# Patient Record
Sex: Female | Born: 1960 | Race: White | Hispanic: No | Marital: Married | Smoking: Never smoker
Health system: Southern US, Community
[De-identification: ages and names within clinical notes are randomized; demographics above are authoritative.]

## PROBLEM LIST (undated history)

## (undated) DIAGNOSIS — E079 Disorder of thyroid, unspecified: Secondary | ICD-10-CM

## (undated) DIAGNOSIS — G4730 Sleep apnea, unspecified: Secondary | ICD-10-CM

## (undated) DIAGNOSIS — M8580 Other specified disorders of bone density and structure, unspecified site: Secondary | ICD-10-CM

## (undated) DIAGNOSIS — M51369 Other intervertebral disc degeneration, lumbar region without mention of lumbar back pain or lower extremity pain: Secondary | ICD-10-CM

## (undated) DIAGNOSIS — E039 Hypothyroidism, unspecified: Secondary | ICD-10-CM

## (undated) DIAGNOSIS — L570 Actinic keratosis: Secondary | ICD-10-CM

## (undated) DIAGNOSIS — N200 Calculus of kidney: Secondary | ICD-10-CM

## (undated) DIAGNOSIS — K219 Gastro-esophageal reflux disease without esophagitis: Secondary | ICD-10-CM

## (undated) DIAGNOSIS — G2581 Restless legs syndrome: Secondary | ICD-10-CM

## (undated) DIAGNOSIS — F419 Anxiety disorder, unspecified: Secondary | ICD-10-CM

## (undated) DIAGNOSIS — M5136 Other intervertebral disc degeneration, lumbar region: Secondary | ICD-10-CM

## (undated) DIAGNOSIS — Z87442 Personal history of urinary calculi: Secondary | ICD-10-CM

## (undated) HISTORY — PX: ABDOMINAL HYSTERECTOMY: SHX81

## (undated) HISTORY — DX: Actinic keratosis: L57.0

## (undated) HISTORY — DX: Restless legs syndrome: G25.81

---

## 2006-06-13 ENCOUNTER — Ambulatory Visit: Payer: Self-pay

## 2007-06-30 ENCOUNTER — Ambulatory Visit: Payer: Self-pay

## 2009-03-13 ENCOUNTER — Ambulatory Visit: Payer: Self-pay | Admitting: Internal Medicine

## 2009-04-16 ENCOUNTER — Ambulatory Visit: Payer: Self-pay | Admitting: Internal Medicine

## 2009-05-21 HISTORY — PX: SUPRACERVICAL ABDOMINAL HYSTERECTOMY: SHX5393

## 2009-06-15 ENCOUNTER — Ambulatory Visit: Payer: Self-pay | Admitting: Unknown Physician Specialty

## 2009-12-05 ENCOUNTER — Ambulatory Visit: Payer: Self-pay | Admitting: Unknown Physician Specialty

## 2009-12-16 ENCOUNTER — Ambulatory Visit: Payer: Self-pay | Admitting: Unknown Physician Specialty

## 2009-12-19 LAB — PATHOLOGY REPORT

## 2010-06-02 ENCOUNTER — Ambulatory Visit: Payer: Self-pay | Admitting: Internal Medicine

## 2010-06-16 ENCOUNTER — Ambulatory Visit: Payer: Self-pay | Admitting: Unknown Physician Specialty

## 2011-04-12 ENCOUNTER — Ambulatory Visit: Payer: Self-pay

## 2011-06-19 ENCOUNTER — Ambulatory Visit: Payer: Self-pay | Admitting: Unknown Physician Specialty

## 2012-05-21 HISTORY — PX: COLONOSCOPY: SHX174

## 2012-07-02 ENCOUNTER — Ambulatory Visit: Payer: Self-pay | Admitting: Internal Medicine

## 2012-10-29 ENCOUNTER — Ambulatory Visit: Payer: Self-pay | Admitting: Family Medicine

## 2013-03-30 ENCOUNTER — Ambulatory Visit: Payer: Self-pay | Admitting: Gastroenterology

## 2013-05-05 ENCOUNTER — Ambulatory Visit: Payer: Self-pay

## 2013-05-28 ENCOUNTER — Ambulatory Visit: Payer: Self-pay

## 2013-07-03 ENCOUNTER — Ambulatory Visit: Payer: Self-pay | Admitting: Internal Medicine

## 2015-01-07 ENCOUNTER — Other Ambulatory Visit: Payer: Self-pay | Admitting: Physical Medicine and Rehabilitation

## 2015-01-07 DIAGNOSIS — M5417 Radiculopathy, lumbosacral region: Secondary | ICD-10-CM

## 2015-01-12 ENCOUNTER — Ambulatory Visit
Admission: RE | Admit: 2015-01-12 | Discharge: 2015-01-12 | Disposition: A | Discharge status: Home / Self Care | Payer: BLUE CROSS/BLUE SHIELD | Source: Ambulatory Visit | Attending: Physical Medicine and Rehabilitation | Admitting: Physical Medicine and Rehabilitation

## 2015-01-12 DIAGNOSIS — M5416 Radiculopathy, lumbar region: Secondary | ICD-10-CM | POA: Insufficient documentation

## 2015-01-12 DIAGNOSIS — M5136 Other intervertebral disc degeneration, lumbar region: Secondary | ICD-10-CM | POA: Insufficient documentation

## 2015-01-12 DIAGNOSIS — M5417 Radiculopathy, lumbosacral region: Secondary | ICD-10-CM

## 2015-01-28 ENCOUNTER — Ambulatory Visit
Admission: EM | Admit: 2015-01-28 | Discharge: 2015-01-28 | Disposition: A | Discharge status: Home / Self Care | Payer: BLUE CROSS/BLUE SHIELD | Attending: Family Medicine | Admitting: Family Medicine

## 2015-01-28 DIAGNOSIS — J0111 Acute recurrent frontal sinusitis: Secondary | ICD-10-CM

## 2015-01-28 HISTORY — DX: Disorder of thyroid, unspecified: E07.9

## 2015-01-28 MED ORDER — IPRATROPIUM-ALBUTEROL 0.5-2.5 (3) MG/3ML IN SOLN: 3.0000 mL | Freq: Once | RESPIRATORY_TRACT | Status: DC

## 2015-01-28 MED ORDER — BENZONATATE 100 MG PO CAPS: 100.0000 mg | ORAL_CAPSULE | Freq: Three times a day (TID) | ORAL | Status: DC | PRN

## 2015-01-28 MED ORDER — AMOXICILLIN-POT CLAVULANATE 875-125 MG PO TABS: 1.0000 | ORAL_TABLET | Freq: Two times a day (BID) | ORAL | Status: AC

## 2015-01-28 NOTE — ED Provider Notes (Signed)
CSN: [id number]     Arrival date & time 01/28/15  1048 History   First MD Initiated Contact with Patient 01/28/15 1303     Chief Complaint  Patient presents with  . Facial Pain   (Consider location/radiation/quality/duration/timing/severity/associated sxs/prior Treatment) HPI   55 yo F with hx of Fall sinus infections- usually treats with Augmentin or Levaquin- has only been sick for 2 days this episode but is leaving on vacation. Feels "really bad" Anxious to take medication with her or take now. Periorbital pressure, full feeling ears, post nasal drip and persistent non-productive cough .Uses ZyrtecD- not Flonase. No fever.  Past Medical History  Diagnosis Date  . Thyroid disease    Past Surgical History  Procedure Laterality Date  . Abdominal hysterectomy     History reviewed. No pertinent family history. Social History  Substance Use Topics  . Smoking status: Never Smoker   . Smokeless tobacco: None  . Alcohol Use: No   OB History    No data available     Review of Systems Review of 10 systems negative for acute change except as referenced in HPI  Allergies  Erythromycin and Neosporin  Home Medications   Prior to Admission medications   Medication Sig Start Date End Date Taking? Authorizing Provider  cetirizine (ZYRTEC) 10 MG chewable tablet Chew 10 mg by mouth daily.   Yes Historical Provider, MD  cholecalciferol (VITAMIN D) 1000 UNITS tablet Take 1,000 Units by mouth daily.   Yes Historical Provider, MD  levothyroxine (SYNTHROID, LEVOTHROID) 100 MCG tablet Take 100 mcg by mouth daily before breakfast.   Yes Historical Provider, MD  progesterone (PROMETRIUM) 100 MG capsule Take 100 mg by mouth daily.   Yes Historical Provider, MD  venlafaxine (EFFEXOR) 37.5 MG tablet Take 37.5 mg by mouth 2 (two) times daily.   Yes Historical Provider, MD  amoxicillin-clavulanate (AUGMENTIN) 875-125 MG per tablet Take 1 tablet by mouth 2 (two) times daily. 01/28/15 02/07/15  Jan Fireman,  PA-C  benzonatate (TESSALON) 100 MG capsule Take 1 capsule (100 mg total) by mouth 3 (three) times daily as needed. 01/28/15   Jan Fireman, PA-C   Meds Ordered and Administered this Visit  Medications - No data to display  BP 127/69 mmHg  Pulse 90  Temp(Src) 98.4 F (36.9 C) (Tympanic)  Resp 16  Ht 5\' 4"  (1.626 m)  Wt 169 lb (76.658 kg)  BMI 28.99 kg/m2  SpO2 99% No data found.   Physical Exam   Constitutional -alert and oriented,well appearing and in mild  acute distress Head-atraumatic, normocephalic Eyes- conjunctiva normal, EOMI ,conjugate gaze Ears- canals clear, TMs retacted  R>L, no erythema Nose- has congestion and rhinorrhea Mouth/throat- mucous membranes moist ,oropharynx non-erythematous, post nasal drip Neck- supple without glandular enlargement CV- regular rate, grossly normal heart sounds,  Resp-no distress, normal respiratory effort,clear to auscultation bilaterally, cough repetitive, dry GI- no distention GU- not examined MSK- non tender, normal ROM, all extremities, ambulatory, self-care Neuro- normal speech and language, no gross focal neurological deficit appreciated,  Skin-warm,dry ,intact; no rash noted Psych-mood and affect grossly normal; speech and behavior grossly normal ED Course  Procedures (including critical care time)  Labs Review Labs Reviewed - No data to display  Imaging Review No results found.  Medications  ipratropium-albuterol (DUONEB) 0.5-2.5 (3) MG/3ML nebulizer solution 3 mL (not administered)   Chest loosened- increased cough briefly-felt better  MDM   1. Acute recurrent frontal sinusitis   .Plan: 1.  Diagnosis reviewed with  patient-seasonal allergies suspected in Sinusitis-work toward better control 2. Fo symptoms Rx Benzonatate, fluticasone,Augmentin, Zyrtec D ; risks, benefits, potential side effects reviewed with patient 3. Recommend supportive treatment with OTC guafenesin DM/tylenol/ibuprofen/fluids 4. Seek  additional medical care if symptoms worsen or don't improve;        Jan Fireman, PA-C 01/28/15 1415

## 2015-01-28 NOTE — ED Notes (Signed)
Pt states sinus pressure for 2 day, severe nasal congestion. States she is going on a cruise next week and does not want to go sick.

## 2015-01-28 NOTE — Discharge Instructions (Signed)
Increase Zyrtec to twice daily for 3 days then decrease to daily Guaifenesin DM 1 tsp every 4 hours Flonase  1spray per nostril twice daily for 3-5 days then decrease to daily Augmentin 1 tablet twice daily for 10 days   Sinusitis Sinusitis is redness, soreness, and inflammation of the paranasal sinuses. Paranasal sinuses are air pockets within the bones of your face (beneath the eyes, the middle of the forehead, or above the eyes). In healthy paranasal sinuses, mucus is able to drain out, and air is able to circulate through them by way of your nose. However, when your paranasal sinuses are inflamed, mucus and air can become trapped. This can allow bacteria and other germs to grow and cause infection. Sinusitis can develop quickly and last only a short time (acute) or continue over a long period (chronic). Sinusitis that lasts for more than 12 weeks is considered chronic.  CAUSES  Causes of sinusitis include:  Allergies.  Structural abnormalities, such as displacement of the cartilage that separates your nostrils (deviated septum), which can decrease the air flow through your nose and sinuses and affect sinus drainage.  Functional abnormalities, such as when the small hairs (cilia) that line your sinuses and help remove mucus do not work properly or are not present. SIGNS AND SYMPTOMS  Symptoms of acute and chronic sinusitis are the same. The primary symptoms are pain and pressure around the affected sinuses. Other symptoms include:  Upper toothache.  Earache.  Headache.  Bad breath.  Decreased sense of smell and taste.  A cough, which worsens when you are lying flat.  Fatigue.  Fever.  Thick drainage from your nose, which often is green and may contain pus (purulent).  Swelling and warmth over the affected sinuses. DIAGNOSIS  Your health care provider will perform a physical exam. During the exam, your health care provider may:  Look in your nose for signs of abnormal  growths in your nostrils (nasal polyps).  Tap over the affected sinus to check for signs of infection.  View the inside of your sinuses (endoscopy) using an imaging device that has a light attached (endoscope). If your health care provider suspects that you have chronic sinusitis, one or more of the following tests may be recommended:  Allergy tests.  Nasal culture. A sample of mucus is taken from your nose, sent to a lab, and screened for bacteria.  Nasal cytology. A sample of mucus is taken from your nose and examined by your health care provider to determine if your sinusitis is related to an allergy. TREATMENT  Most cases of acute sinusitis are related to a viral infection and will resolve on their own within 10 days. Sometimes medicines are prescribed to help relieve symptoms (pain medicine, decongestants, nasal steroid sprays, or saline sprays).  However, for sinusitis related to a bacterial infection, your health care provider will prescribe antibiotic medicines. These are medicines that will help kill the bacteria causing the infection.  Rarely, sinusitis is caused by a fungal infection. In theses cases, your health care provider will prescribe antifungal medicine. For some cases of chronic sinusitis, surgery is needed. Generally, these are cases in which sinusitis recurs more than 3 times per year, despite other treatments. HOME CARE INSTRUCTIONS   Drink plenty of water. Water helps thin the mucus so your sinuses can drain more easily.  Use a humidifier.  Inhale steam 3 to 4 times a day (for example, sit in the bathroom with the shower running).  Apply a warm,  moist washcloth to your face 3 to 4 times a day, or as directed by your health care provider.  Use saline nasal sprays to help moisten and clean your sinuses.  Take medicines only as directed by your health care provider.  If you were prescribed either an antibiotic or antifungal medicine, finish it all even if you start  to feel better. SEEK IMMEDIATE MEDICAL CARE IF:  You have increasing pain or severe headaches.  You have nausea, vomiting, or drowsiness.  You have swelling around your face.  You have vision problems.  You have a stiff neck.  You have difficulty breathing. MAKE SURE YOU:   Understand these instructions.  Will watch your condition.  Will get help right away if you are not doing well or get worse. Document Released: 05/07/2005 Document Revised: 09/21/2013 Document Reviewed: 05/22/2011 [facility] Patient Information 2015 [place], [place]. This information is not intended to replace advice given to you by your health care provider. Make sure you discuss any questions you have with your health care provider.

## 2017-01-07 ENCOUNTER — Other Ambulatory Visit: Payer: Self-pay | Admitting: Obstetrics and Gynecology

## 2017-01-07 NOTE — Telephone Encounter (Signed)
Please advise for refill. Thank you.  

## 2017-03-11 ENCOUNTER — Other Ambulatory Visit: Payer: Self-pay | Admitting: Internal Medicine

## 2017-03-11 DIAGNOSIS — Z1231 Encounter for screening mammogram for malignant neoplasm of breast: Secondary | ICD-10-CM

## 2017-03-14 ENCOUNTER — Ambulatory Visit
Admission: RE | Admit: 2017-03-14 | Discharge: 2017-03-14 | Disposition: A | Discharge status: Home / Self Care | Payer: Managed Care, Other (non HMO) | Source: Ambulatory Visit | Attending: Internal Medicine | Admitting: Internal Medicine

## 2017-03-14 DIAGNOSIS — Z1231 Encounter for screening mammogram for malignant neoplasm of breast: Secondary | ICD-10-CM | POA: Diagnosis present

## 2017-03-18 ENCOUNTER — Other Ambulatory Visit: Payer: Self-pay | Admitting: Internal Medicine

## 2017-03-18 DIAGNOSIS — R928 Other abnormal and inconclusive findings on diagnostic imaging of breast: Secondary | ICD-10-CM

## 2017-03-18 DIAGNOSIS — N6320 Unspecified lump in the left breast, unspecified quadrant: Secondary | ICD-10-CM

## 2017-03-22 ENCOUNTER — Ambulatory Visit
Admission: RE | Admit: 2017-03-22 | Discharge: 2017-03-22 | Disposition: A | Discharge status: Home / Self Care | Payer: Managed Care, Other (non HMO) | Source: Ambulatory Visit | Attending: Internal Medicine | Admitting: Internal Medicine

## 2017-03-22 DIAGNOSIS — N6320 Unspecified lump in the left breast, unspecified quadrant: Secondary | ICD-10-CM

## 2017-03-22 DIAGNOSIS — N6321 Unspecified lump in the left breast, upper outer quadrant: Secondary | ICD-10-CM | POA: Diagnosis not present

## 2017-03-22 DIAGNOSIS — N6323 Unspecified lump in the left breast, lower outer quadrant: Secondary | ICD-10-CM | POA: Diagnosis not present

## 2017-03-22 DIAGNOSIS — N6322 Unspecified lump in the left breast, upper inner quadrant: Secondary | ICD-10-CM | POA: Insufficient documentation

## 2017-03-22 DIAGNOSIS — R928 Other abnormal and inconclusive findings on diagnostic imaging of breast: Secondary | ICD-10-CM

## 2017-03-25 ENCOUNTER — Other Ambulatory Visit: Payer: Self-pay | Admitting: Internal Medicine

## 2017-03-25 DIAGNOSIS — N6320 Unspecified lump in the left breast, unspecified quadrant: Secondary | ICD-10-CM

## 2017-03-25 DIAGNOSIS — R928 Other abnormal and inconclusive findings on diagnostic imaging of breast: Secondary | ICD-10-CM

## 2017-03-26 ENCOUNTER — Other Ambulatory Visit: Payer: Managed Care, Other (non HMO)

## 2017-03-26 ENCOUNTER — Ambulatory Visit: Payer: Managed Care, Other (non HMO)

## 2017-04-03 ENCOUNTER — Other Ambulatory Visit: Payer: Self-pay | Admitting: Internal Medicine

## 2017-04-03 ENCOUNTER — Ambulatory Visit
Admission: RE | Admit: 2017-04-03 | Discharge: 2017-04-03 | Disposition: A | Discharge status: Home / Self Care | Payer: Managed Care, Other (non HMO) | Source: Ambulatory Visit | Attending: Internal Medicine | Admitting: Internal Medicine

## 2017-04-03 DIAGNOSIS — N6320 Unspecified lump in the left breast, unspecified quadrant: Secondary | ICD-10-CM

## 2017-04-03 DIAGNOSIS — R928 Other abnormal and inconclusive findings on diagnostic imaging of breast: Secondary | ICD-10-CM

## 2017-04-17 ENCOUNTER — Ambulatory Visit (INDEPENDENT_AMBULATORY_CARE_PROVIDER_SITE_OTHER): Payer: Managed Care, Other (non HMO) | Admitting: Obstetrics and Gynecology

## 2017-04-17 ENCOUNTER — Other Ambulatory Visit: Payer: Self-pay

## 2017-04-17 VITALS — BP 120/82 | HR 76 | Ht 64.0 in | Wt 174.0 lb

## 2017-04-17 DIAGNOSIS — Z79890 Hormone replacement therapy (postmenopausal): Secondary | ICD-10-CM

## 2017-04-17 DIAGNOSIS — Z1231 Encounter for screening mammogram for malignant neoplasm of breast: Secondary | ICD-10-CM | POA: Diagnosis not present

## 2017-04-17 DIAGNOSIS — N951 Menopausal and female climacteric states: Secondary | ICD-10-CM

## 2017-04-17 DIAGNOSIS — Z1151 Encounter for screening for human papillomavirus (HPV): Secondary | ICD-10-CM

## 2017-04-17 DIAGNOSIS — Z1239 Encounter for other screening for malignant neoplasm of breast: Secondary | ICD-10-CM

## 2017-04-17 DIAGNOSIS — N6320 Unspecified lump in the left breast, unspecified quadrant: Secondary | ICD-10-CM | POA: Diagnosis not present

## 2017-04-17 DIAGNOSIS — Z01419 Encounter for gynecological examination (general) (routine) without abnormal findings: Secondary | ICD-10-CM | POA: Diagnosis not present

## 2017-04-17 DIAGNOSIS — Z124 Encounter for screening for malignant neoplasm of cervix: Secondary | ICD-10-CM

## 2017-04-17 MED ORDER — PROGESTERONE MICRONIZED 100 MG PO CAPS: ORAL_CAPSULE | ORAL | 3 refills | Status: DC

## 2017-04-17 NOTE — Progress Notes (Signed)
PCP: Idelle Crouch, MD   Chief Complaint  Patient presents with  . Gynecologic Exam    No Complaints    HPI:      [name] is a 56 y.o. No obstetric history on file. who LMP was No LMP recorded. Patient has had a hysterectomy., presents today for her annual examination.  Her menses are absent due to menopause/lap supracx hyst due to leio and pelvic pain. She does not have intermenstrual bleeding.  She does not have vasomotor sx. She uses minivelle patch 0.0375 once wkly instead of twice and prometrium 100 mg 6 nights on, 1 night off with sx control.  Sex activity: single partner, contraception - post menopausal status. She does not have vaginal dryness.  Last Pap: August 20, 2013  Results were: no abnormalities /neg HPV DNA.  Hx of STDs: none  Last mammogram: March 22, 2017  Results were: cat 3 for LT breast probable fat lobule on u/s. Repeat LT breast u/s due in 6 months. There is no FH of breast cancer. There is no FH of ovarian cancer. The patient does do self-breast exams.  Colonoscopy: colonoscopy 4 years ago without abnormalities. . Repeat due after 10 years.   Tobacco use: The patient denies current or previous tobacco use. Alcohol use: none Exercise: not active  She does get adequate calcium and Vitamin D in her diet.  Labs with PCP.  Past Medical History:  Diagnosis Date  . Restless leg syndrome   . Thyroid disease     Past Surgical History:  Procedure Laterality Date  . COLONOSCOPY  2014   wnl repeat 10 yrs  . SUPRACERVICAL ABDOMINAL HYSTERECTOMY  2011   pelvic pain and leio    Family History  Problem Relation Age of Onset  . Hypothyroidism Mother   . Heart disease Father   . Hyperlipidemia Father   . Heart disease Sister   . Breast cancer Neg Hx     Social History   Socioeconomic History  . Marital status: Widowed    Spouse name: Not on file  . Number of children: Not on file  . Years of education: Not on file  . Highest  education level: Not on file  Social Needs  . Financial resource strain: Not on file  . Food insecurity - worry: Not on file  . Food insecurity - inability: Not on file  . Transportation needs - medical: Not on file  . Transportation needs - non-medical: Not on file  Occupational History  . Not on file  Tobacco Use  . Smoking status: Never Smoker  . Smokeless tobacco: Never Used  Substance and Sexual Activity  . Alcohol use: No  . Drug use: No  . Sexual activity: Yes    Partners: Male  Other Topics Concern  . Not on file  Social History Narrative  . Not on file    Current Meds  Medication Sig  . cetirizine (ZYRTEC) 10 MG chewable tablet Chew 10 mg by mouth daily.  . cholecalciferol (VITAMIN D) 1000 UNITS tablet Take 1,000 Units by mouth daily.  Marland Kitchen doxycycline (ORACEA) 40 MG capsule Take 40 mg by mouth every morning.  Marland Kitchen estradiol (VIVELLE-DOT) 0.0375 MG/24HR Place 1 patch onto the skin 2 (two) times a week.  . levothyroxine (SYNTHROID, LEVOTHROID) 100 MCG tablet Take 100 mcg by mouth daily before breakfast.  . pantoprazole (PROTONIX) 20 MG tablet Take 20 mg by mouth daily.  . phentermine 37.5 MG capsule Take 37.5  mg by mouth every morning.  . progesterone (PROMETRIUM) 100 MG capsule TAKE 1 CAPSULE AT BEDTIME, 6 NIGHTS ON 1 NIGHT OFF  . rOPINIRole (REQUIP) 0.5 MG tablet Take 0.5 mg by mouth 3 (three) times daily.  Marland Kitchen venlafaxine (EFFEXOR) 37.5 MG tablet Take 37.5 mg by mouth 2 (two) times daily.  . [DISCONTINUED] progesterone (PROMETRIUM) 100 MG capsule TAKE 1 CAPSULE AT BEDTIME, 6 NIGHTS ON 1 NIGHT OFF      ROS:  Review of Systems  Constitutional: Negative for fatigue, fever and unexpected weight change.  Respiratory: Negative for cough, shortness of breath and wheezing.   Cardiovascular: Negative for chest pain, palpitations and leg swelling.  Gastrointestinal: Negative for blood in stool, constipation, diarrhea, nausea and vomiting.  Endocrine: Negative for cold  intolerance, heat intolerance and polyuria.  Genitourinary: Negative for dyspareunia, dysuria, flank pain, frequency, genital sores, hematuria, menstrual problem, pelvic pain, urgency, vaginal bleeding, vaginal discharge and vaginal pain.  Musculoskeletal: Negative for back pain, joint swelling and myalgias.  Skin: Negative for rash.  Neurological: Negative for dizziness, syncope, light-headedness, numbness and headaches.  Hematological: Negative for adenopathy.  Psychiatric/Behavioral: Negative for agitation, confusion, sleep disturbance and suicidal ideas. The patient is not nervous/anxious.      Objective: BP 120/82 (BP Location: Left Arm, Patient Position: Sitting, Cuff Size: Normal)   Pulse 76   Ht 5\' 4"  (1.626 m)   Wt 174 lb (78.9 kg)   BMI 29.87 kg/m    Physical Exam  Constitutional: She is oriented to person, place, and time. She appears well-developed and well-nourished.  Genitourinary: Vagina normal. There is no rash or tenderness on the right labia. There is no rash or tenderness on the left labia. No erythema or tenderness in the vagina. No vaginal discharge found. Right adnexum does not display mass and does not display tenderness. Left adnexum does not display mass and does not display tenderness. Cervix does not exhibit motion tenderness, lesion or friability.  Genitourinary Comments: UTERUS SURG REM  Neck: Normal range of motion. No thyromegaly present.  Cardiovascular: Normal rate, regular rhythm and normal heart sounds.  No murmur heard. Pulmonary/Chest: Effort normal and breath sounds normal. Right breast exhibits no mass, no nipple discharge, no skin change and no tenderness. Left breast exhibits no mass, no nipple discharge, no skin change and no tenderness.  Abdominal: Soft. There is no tenderness. There is no guarding.  Musculoskeletal: Normal range of motion.  Neurological: She is alert and oriented to person, place, and time. No cranial nerve deficit.    Psychiatric: She has a normal mood and affect. Her behavior is normal.  Vitals reviewed.   Assessment/Plan:  Encounter for annual routine gynecological examination  Cervical cancer screening - Plan: IGP, Aptima HPV  Screening for HPV (human papillomavirus) - Plan: IGP, Aptima HPV  Screening for breast cancer - Pt to sched LT breast u/s f/u 4/19. - Plan: US BREAST LTD UNI LEFT INC AXILLA  Left breast mass - Plan: US BREAST LTD UNI LEFT INC AXILLA  Hormone replacement therapy (HRT) - Rx RF prometrium to mail order. Pt to stop minivelle to see if sx tolerable off ERT. Will RF if sx recur. If ok off ERT, will then wean off prog. F/u prn. - Plan: progesterone (PROMETRIUM) 100 MG capsule  Vasomotor symptoms due to menopause - Plan: progesterone (PROMETRIUM) 100 MG capsule   Meds ordered this encounter  Medications  . progesterone (PROMETRIUM) 100 MG capsule    Sig: TAKE 1 CAPSULE AT BEDTIME, 6  NIGHTS ON 1 NIGHT OFF    Dispense:  90 capsule    Refill:  3    PLS DO NOT FILL RX YET--PLACE ON HOLD.            GYN counsel breast self exam, mammography screening, menopause, adequate intake of calcium and vitamin D, diet and exercise    F/U  Return in about 1 year (around 04/17/2018).  Alicia B. Copland, PA-C 04/17/2017 9:14 AM

## 2017-04-17 NOTE — Patient Instructions (Signed)
I value your feedback and entrusting us with your care. If you get a [facility] patient survey, I would appreciate you taking the time to let us know about your experience today. Thank you! 

## 2017-04-19 LAB — IGP, APTIMA HPV
HPV APTIMA: NEGATIVE
PAP Smear Comment: 0

## 2017-05-07 ENCOUNTER — Encounter: Payer: Self-pay | Admitting: Obstetrics and Gynecology

## 2017-05-07 ENCOUNTER — Other Ambulatory Visit: Payer: Self-pay | Admitting: Obstetrics and Gynecology

## 2017-05-07 MED ORDER — ESTRADIOL 0.5 MG PO TABS: 0.5000 mg | ORAL_TABLET | Freq: Every day | ORAL | 9 refills | Status: DC

## 2017-05-07 NOTE — Progress Notes (Signed)
Rx estradiol for vasomotor sx. Patch too expensive. Tried to wean off.

## 2017-06-17 ENCOUNTER — Other Ambulatory Visit: Payer: Self-pay | Admitting: Obstetrics and Gynecology

## 2017-06-17 ENCOUNTER — Telehealth: Payer: Self-pay

## 2017-06-17 MED ORDER — ESTRADIOL 0.5 MG PO TABS: 0.5000 mg | ORAL_TABLET | Freq: Every day | ORAL | 2 refills | Status: DC

## 2017-06-17 NOTE — Telephone Encounter (Signed)
Done

## 2017-06-17 NOTE — Telephone Encounter (Signed)
Pt aware.

## 2017-06-17 NOTE — Telephone Encounter (Signed)
[facility]. Where does pt want it sent? It's in system as estrace crm, with 10 months worth, sent in 12/18.

## 2017-06-17 NOTE — Telephone Encounter (Signed)
Pt needs it sent to [facility].  Has new Rx Card which has not been scanned into system. BIN# [id number] RX Group # [id number].

## 2017-06-17 NOTE — Progress Notes (Signed)
Rx RF to new mail order.

## 2017-06-17 NOTE — Telephone Encounter (Signed)
Pt saw ABC 04/17/17. Rxs not showing on [facility].  MyChart says they can't be renewed.  Pt needs refills.  [phone]

## 2017-06-19 ENCOUNTER — Telehealth: Payer: Self-pay

## 2017-06-19 NOTE — Telephone Encounter (Signed)
Pt states she had a rx for progesterone & says it has not been sent to the [facility]. She did see where the other one was mailed in to them, but no the progesterone. She believe it was sent to the old pharmacy & needs to be sent to the new one. Cb#[phone]

## 2017-06-19 NOTE — Telephone Encounter (Signed)
Spoke w/pt. Notified rx for Progesterone/Prometrium was sent to [facility] on the day of her AE 04/17/17 with receipt from pharmacy 9:13am. Pt will check w/pharmacy & contact us back if she needs further assistance.

## 2017-07-10 ENCOUNTER — Other Ambulatory Visit: Payer: Self-pay

## 2017-07-10 ENCOUNTER — Ambulatory Visit
Admission: EM | Admit: 2017-07-10 | Discharge: 2017-07-10 | Disposition: A | Discharge status: Home / Self Care | Payer: Managed Care, Other (non HMO) | Attending: Family Medicine | Admitting: Family Medicine

## 2017-07-10 DIAGNOSIS — H6502 Acute serous otitis media, left ear: Secondary | ICD-10-CM

## 2017-07-10 MED ORDER — AMOXICILLIN 875 MG PO TABS: 875.0000 mg | ORAL_TABLET | Freq: Two times a day (BID) | ORAL | 0 refills | Status: DC

## 2017-07-10 NOTE — ED Triage Notes (Signed)
Pt reports "I have a sinus infection." Sx x Sunday of congestion, otalgia, facial pain and pressure, headache.

## 2017-07-10 NOTE — ED Provider Notes (Signed)
[facility]    CSN: [id number] Arrival date & time: 07/10/17  0910     History   Chief Complaint Chief Complaint  Patient presents with  . Sinusitis    HPI RONALEE SCHEUNEMANN is a 57 y.o. female.    Sinusitis  Associated symptoms: congestion, cough, ear pain, fever and rhinorrhea   Associated symptoms: no wheezing   URI  Presenting symptoms: congestion, cough, ear pain, facial pain, fever and rhinorrhea   Severity:  Moderate Onset quality:  Sudden Duration:  4 days Timing:  Constant Progression:  Worsening Chronicity:  New Relieved by:  Nothing Ineffective treatments:  OTC medications Associated symptoms: sinus pain   Associated symptoms: no wheezing   Risk factors: sick contacts   Risk factors: not elderly, no chronic cardiac disease, no chronic kidney disease, no chronic respiratory disease, no diabetes mellitus, no immunosuppression, no recent illness and no recent travel     Past Medical History:  Diagnosis Date  . Restless leg syndrome   . Thyroid disease     There are no active problems to display for this patient.   Past Surgical History:  Procedure Laterality Date  . COLONOSCOPY  2014   wnl repeat 10 yrs  . SUPRACERVICAL ABDOMINAL HYSTERECTOMY  2011   pelvic pain and leio    OB History    No data available       Home Medications    Prior to Admission medications   Medication Sig Start Date End Date Taking? Authorizing Provider  amoxicillin (AMOXIL) 875 MG tablet Take 1 tablet (875 mg total) by mouth 2 (two) times daily. 07/10/17   Norval Gable, MD  benzonatate (TESSALON) 100 MG capsule Take 1 capsule (100 mg total) by mouth 3 (three) times daily as needed. Patient not taking: Reported on 04/17/2017 01/28/15   Jan Fireman, PA-C  cetirizine (ZYRTEC) 10 MG chewable tablet Chew 10 mg by mouth daily.    [provider]  cholecalciferol (VITAMIN D) 1000 UNITS tablet Take 1,000 Units by mouth daily.    [provider]    doxycycline (ORACEA) 40 MG capsule Take 40 mg by mouth every morning.    [provider]  estradiol (ESTRACE) 0.5 MG tablet Take 1 tablet (0.5 mg total) by mouth daily. 7/61/95   Copland, Deirdre Evener, PA-C  levothyroxine (SYNTHROID, LEVOTHROID) 100 MCG tablet Take 100 mcg by mouth daily before breakfast.    [provider]  pantoprazole (PROTONIX) 20 MG tablet Take 20 mg by mouth daily.    [provider]  phentermine 37.5 MG capsule Take 37.5 mg by mouth every morning.    [provider]  progesterone (PROMETRIUM) 100 MG capsule TAKE 1 CAPSULE AT BEDTIME, 6 NIGHTS ON 1 NIGHT OFF 09/32/67   Copland, Alicia B, PA-C  rOPINIRole (REQUIP) 0.5 MG tablet Take 0.5 mg by mouth 3 (three) times daily.    [provider]  venlafaxine (EFFEXOR) 37.5 MG tablet Take 37.5 mg by mouth 2 (two) times daily.    [provider]    Family History Family History  Problem Relation Age of Onset  . Hypothyroidism Mother   . Heart disease Father   . Hyperlipidemia Father   . Heart disease Sister   . Breast cancer Neg Hx     Social History Social History   Tobacco Use  . Smoking status: Never Smoker  . Smokeless tobacco: Never Used  Substance Use Topics  . Alcohol use: No  . Drug use:  No     Allergies   Erythromycin and Neosporin [neomycin-bacitracin zn-polymyx]   Review of Systems Review of Systems  Constitutional: Positive for fever.  HENT: Positive for congestion, ear pain, rhinorrhea and sinus pain.   Respiratory: Positive for cough. Negative for wheezing.      Physical Exam Triage Vital Signs ED Triage Vitals  Enc Vitals Group     BP 07/10/17 0928 (!) 142/69     Pulse Rate 07/10/17 0928 (!) 114     Resp 07/10/17 0928 18     Temp 07/10/17 0928 98.6 F (37 C)     Temp Source 07/10/17 0928 Oral     SpO2 07/10/17 0928 100 %     Weight 07/10/17 0929 170 lb (77.1 kg)     Height 07/10/17 0929 5\' 4"  (1.626 m)     Head Circumference --       Peak Flow --      Pain Score 07/10/17 0929 4     Pain Loc --      Pain Edu? --      Excl. in [place]? --    No data found.  Updated Vital Signs BP (!) 142/69 (BP Location: Left Arm)   Pulse (!) 114   Temp 98.6 F (37 C) (Oral)   Resp 18   Ht 5\' 4"  (1.626 m)   Wt 170 lb (77.1 kg)   SpO2 100%   BMI 29.18 kg/m   Visual Acuity Right Eye Distance:   Left Eye Distance:   Bilateral Distance:    Right Eye Near:   Left Eye Near:    Bilateral Near:     Physical Exam  Constitutional: She appears well-developed and well-nourished. No distress.  HENT:  Head: Normocephalic and atraumatic.  Right Ear: Tympanic membrane, external ear and ear canal normal.  Left Ear: External ear and ear canal normal. Tympanic membrane is erythematous and bulging. A middle ear effusion is present.  Nose: Rhinorrhea present. No mucosal edema, nose lacerations, sinus tenderness, nasal deformity, septal deviation or nasal septal hematoma. No epistaxis.  No foreign bodies. Right sinus exhibits no maxillary sinus tenderness and no frontal sinus tenderness. Left sinus exhibits no maxillary sinus tenderness and no frontal sinus tenderness.  Mouth/Throat: Uvula is midline, oropharynx is clear and moist and mucous membranes are normal. No oropharyngeal exudate.  Eyes: Conjunctivae and EOM are normal. Pupils are equal, round, and reactive to light. Right eye exhibits no discharge. Left eye exhibits no discharge. No scleral icterus.  Neck: Normal range of motion. Neck supple. No thyromegaly present.  Cardiovascular: Normal rate, regular rhythm and normal heart sounds.  Pulmonary/Chest: Effort normal and breath sounds normal. No respiratory distress. She has no wheezes. She has no rales.  Lymphadenopathy:    She has no cervical adenopathy.  Skin: She is not diaphoretic.  Nursing note and vitals reviewed.    UC Treatments / Results  Labs (all labs ordered are listed, but only abnormal results are displayed) Labs  Reviewed - No data to display  EKG  EKG Interpretation None       Radiology No results found.  Procedures Procedures (including critical care time)  Medications Ordered in UC Medications - No data to display   Initial Impression / Assessment and Plan / UC Course  I have reviewed the triage vital signs and the nursing notes.  Pertinent labs & imaging results that were available during my care of the patient were reviewed by me and considered in my medical  decision making (see chart for details).      Final Clinical Impressions(s) / UC Diagnoses   Final diagnoses:  Acute serous otitis media of left ear, recurrence not specified    ED Discharge Orders        Ordered    amoxicillin (AMOXIL) 875 MG tablet  2 times daily     07/10/17 1000     1. diagnosis reviewed with patient 2. rx as per orders above; reviewed possible side effects, interactions, risks and benefits  3. Recommend supportive treatment with otc analgesics prn   4. Follow-up prn if symptoms worsen or don't improve  Controlled Substance Prescriptions [place] Controlled Substance Registry consulted? Not Applicable   Norval Gable, MD 07/10/17 1020

## 2017-07-12 ENCOUNTER — Telehealth: Payer: Self-pay | Admitting: Emergency Medicine

## 2017-07-12 MED ORDER — LORATADINE-PSEUDOEPHEDRINE ER 10-240 MG PO TB24: 1.0000 | ORAL_TABLET | Freq: Every day | ORAL | 0 refills | Status: DC

## 2017-07-12 MED ORDER — FLUTICASONE PROPIONATE 50 MCG/ACT NA SUSP: 2.0000 | Freq: Every day | NASAL | 0 refills | Status: DC

## 2017-07-12 NOTE — Telephone Encounter (Signed)
Patient called with continued congestion requesting prescription of Flonase and Claritin-D.  Will E prescribe to pharmacy of choice.

## 2017-09-20 ENCOUNTER — Ambulatory Visit
Admission: RE | Admit: 2017-09-20 | Discharge: 2017-09-20 | Disposition: A | Discharge status: Home / Self Care | Payer: Managed Care, Other (non HMO) | Source: Ambulatory Visit | Attending: Obstetrics and Gynecology | Admitting: Obstetrics and Gynecology

## 2017-09-20 DIAGNOSIS — N6002 Solitary cyst of left breast: Secondary | ICD-10-CM | POA: Diagnosis not present

## 2017-09-20 DIAGNOSIS — N6321 Unspecified lump in the left breast, upper outer quadrant: Secondary | ICD-10-CM | POA: Diagnosis not present

## 2017-09-20 DIAGNOSIS — N6320 Unspecified lump in the left breast, unspecified quadrant: Secondary | ICD-10-CM

## 2017-09-20 DIAGNOSIS — Z1239 Encounter for other screening for malignant neoplasm of breast: Secondary | ICD-10-CM

## 2017-09-23 ENCOUNTER — Telehealth: Payer: Self-pay | Admitting: Obstetrics and Gynecology

## 2017-09-23 DIAGNOSIS — Z1239 Encounter for other screening for malignant neoplasm of breast: Secondary | ICD-10-CM

## 2017-09-23 DIAGNOSIS — R928 Other abnormal and inconclusive findings on diagnostic imaging of breast: Secondary | ICD-10-CM

## 2017-09-23 NOTE — Telephone Encounter (Signed)
Pt aware of recent LT breast u/s results. No mass visible on f/u to bx. Repeat dx mammo bilat and LT breast u/s in 6 months recommended. Orders placed.

## 2018-03-18 ENCOUNTER — Other Ambulatory Visit: Payer: Self-pay | Admitting: Obstetrics and Gynecology

## 2018-03-18 ENCOUNTER — Telehealth: Payer: Self-pay

## 2018-03-18 MED ORDER — ESTRADIOL 0.5 MG PO TABS: 0.5000 mg | ORAL_TABLET | Freq: Every day | ORAL | 0 refills | Status: DC

## 2018-03-18 NOTE — Telephone Encounter (Signed)
Pt needs refill on estrace, enough to last till annual.

## 2018-03-18 NOTE — Telephone Encounter (Signed)
Please advise 

## 2018-03-18 NOTE — Telephone Encounter (Signed)
Rx eRxd to mail order.

## 2018-03-18 NOTE — Progress Notes (Signed)
Rx RF till annual 12/19.

## 2018-03-23 ENCOUNTER — Other Ambulatory Visit: Payer: Self-pay | Admitting: Obstetrics and Gynecology

## 2018-03-24 ENCOUNTER — Encounter: Payer: Self-pay | Admitting: Obstetrics and Gynecology

## 2018-03-24 ENCOUNTER — Ambulatory Visit
Admission: RE | Admit: 2018-03-24 | Discharge: 2018-03-24 | Disposition: A | Discharge status: Home / Self Care | Payer: Managed Care, Other (non HMO) | Source: Ambulatory Visit | Attending: Obstetrics and Gynecology | Admitting: Obstetrics and Gynecology

## 2018-03-24 DIAGNOSIS — Z1239 Encounter for other screening for malignant neoplasm of breast: Secondary | ICD-10-CM

## 2018-03-24 DIAGNOSIS — R928 Other abnormal and inconclusive findings on diagnostic imaging of breast: Secondary | ICD-10-CM | POA: Diagnosis not present

## 2018-03-27 ENCOUNTER — Other Ambulatory Visit: Payer: Self-pay

## 2018-03-27 ENCOUNTER — Telehealth: Payer: Self-pay

## 2018-03-27 MED ORDER — ESTRADIOL 0.5 MG PO TABS: 0.5000 mg | ORAL_TABLET | Freq: Every day | ORAL | 0 refills | Status: DC

## 2018-03-27 NOTE — Telephone Encounter (Signed)
Pt states she has a apt with [facility] on DEC 5th she needs a refill of her estradiol, states the pharmacy sent a request and it was rejected. Please advise

## 2018-03-27 NOTE — Telephone Encounter (Signed)
Spoke to pt and updated her pharmacies since RF for estrace was sent to an old mail order service, she asked [facility] and I [facility]. Pt aware of it and reminded her of annual 12/9.

## 2018-04-27 NOTE — Progress Notes (Signed)
PCP: Idelle Crouch, MD   Chief Complaint  Patient presents with  . Gynecologic Exam    HPI:      [name] is a 57 y.o. No obstetric history on file. who LMP was No LMP recorded. Patient has had a hysterectomy., presents today for her annual examination.  Her menses are absent due to menopause/lap supracx hyst due to leio and pelvic pain. She does not have intermenstrual bleeding.  She does not have vasomotor sx. She uses estradiol 0.5 mg daily and prometrium 100 mg 6 nights on, 1 night off with sx control. Sx recur if she misses a dose. Tried to come off ERT patch last yr with sx recurrence. Wanted to try oral due to cost. Sex activity: single partner, contraception - post menopausal status. She does not have vaginal dryness.  Last Pap:04/17/17 Results were: no abnormalities /neg HPV DNA.  Hx of STDs: none  Last mammogram: 03/24/18  Results were: normal. Repeat in 12 months. 11/18 Cat 3 for LT breast probable fat lobule on u/s; stable with recent imaging.  There is no FH of breast cancer. There is no FH of ovarian cancer. The patient does do self-breast exams.  Colonoscopy: colonoscopy 5 years ago without abnormalities.  Repeat due after 10 years.   Tobacco use: The patient denies current or previous tobacco use. Alcohol use: none Exercise: not active  She does get adequate calcium and Vitamin D in her diet.  Labs with PCP.  Past Medical History:  Diagnosis Date  . Restless leg syndrome   . Thyroid disease     Past Surgical History:  Procedure Laterality Date  . ABDOMINAL HYSTERECTOMY    . COLONOSCOPY  2014   wnl repeat 10 yrs  . SUPRACERVICAL ABDOMINAL HYSTERECTOMY  2011   pelvic pain and leio    Family History  Problem Relation Age of Onset  . Hypothyroidism Mother   . Heart disease Father   . Hyperlipidemia Father   . Heart disease Sister   . Breast cancer Neg Hx     Social History   Socioeconomic History  . Marital status: Widowed   Spouse name: Not on file  . Number of children: Not on file  . Years of education: Not on file  . Highest education level: Not on file  Occupational History  . Not on file  Social Needs  . Financial resource strain: Not on file  . Food insecurity:    Worry: Not on file    Inability: Not on file  . Transportation needs:    Medical: Not on file    Non-medical: Not on file  Tobacco Use  . Smoking status: Never Smoker  . Smokeless tobacco: Never Used  Substance and Sexual Activity  . Alcohol use: No  . Drug use: No  . Sexual activity: Yes    Partners: Male    Birth control/protection: Surgical    Comment: Hysterectomy  Lifestyle  . Physical activity:    Days per week: Not on file    Minutes per session: Not on file  . Stress: Not on file  Relationships  . Social connections:    Talks on phone: Not on file    Gets together: Not on file    Attends religious service: Not on file    Active member of club or organization: Not on file    Attends meetings of clubs or organizations: Not on file    Relationship status: Not on file  .  Intimate partner violence:    Fear of current or ex partner: Not on file    Emotionally abused: Not on file    Physically abused: Not on file    Forced sexual activity: Not on file  Other Topics Concern  . Not on file  Social History Narrative  . Not on file    Current Meds  Medication Sig  . cetirizine (ZYRTEC) 10 MG chewable tablet Chew 10 mg by mouth daily.  . cholecalciferol (VITAMIN D) 1000 UNITS tablet Take 1,000 Units by mouth daily.  Marland Kitchen desonide (DESOWEN) 0.05 % lotion   . doxycycline (ORACEA) 40 MG capsule Take 40 mg by mouth every morning.  Marland Kitchen estradiol (ESTRACE) 0.5 MG tablet Take 1 tablet (0.5 mg total) by mouth daily.  Marland Kitchen levothyroxine (SYNTHROID, LEVOTHROID) 25 MCG tablet Take by mouth.  . pantoprazole (PROTONIX) 20 MG tablet Take 20 mg by mouth daily.  . progesterone (PROMETRIUM) 100 MG capsule TAKE 1 CAPSULE AT BEDTIME, 6 NIGHTS  ON 1 NIGHT OFF  . rOPINIRole (REQUIP) 0.25 MG tablet Take by mouth.  . venlafaxine (EFFEXOR) 37.5 MG tablet Take 37.5 mg by mouth 2 (two) times daily.  . [DISCONTINUED] estradiol (ESTRACE) 0.5 MG tablet Take 1 tablet (0.5 mg total) by mouth daily.  . [DISCONTINUED] progesterone (PROMETRIUM) 100 MG capsule TAKE 1 CAPSULE AT BEDTIME, 6 NIGHTS ON 1 NIGHT OFF      ROS:  Review of Systems  Constitutional: Negative for fatigue, fever and unexpected weight change.  Respiratory: Negative for cough, shortness of breath and wheezing.   Cardiovascular: Negative for chest pain, palpitations and leg swelling.  Gastrointestinal: Negative for blood in stool, constipation, diarrhea, nausea and vomiting.  Endocrine: Negative for cold intolerance, heat intolerance and polyuria.  Genitourinary: Negative for dyspareunia, dysuria, flank pain, frequency, genital sores, hematuria, menstrual problem, pelvic pain, urgency, vaginal bleeding, vaginal discharge and vaginal pain.  Musculoskeletal: Negative for back pain, joint swelling and myalgias.  Skin: Negative for rash.  Neurological: Negative for dizziness, syncope, light-headedness, numbness and headaches.  Hematological: Negative for adenopathy.  Psychiatric/Behavioral: Negative for agitation, confusion, sleep disturbance and suicidal ideas. The patient is not nervous/anxious.      Objective: BP 122/70   Pulse 86   Ht 5\' 4"  (1.626 m)   Wt 186 lb (84.4 kg)   BMI 31.93 kg/m    Physical Exam  Constitutional: She is oriented to person, place, and time. She appears well-developed and well-nourished.  Genitourinary: Vagina normal. There is no rash or tenderness on the right labia. There is no rash or tenderness on the left labia. No erythema or tenderness in the vagina. No vaginal discharge found. Right adnexum does not display mass and does not display tenderness. Left adnexum does not display mass and does not display tenderness. Cervix does not exhibit  motion tenderness, lesion or friability.  Genitourinary Comments: UTERUS SURG REM  Neck: Normal range of motion. No thyromegaly present.  Cardiovascular: Normal rate, regular rhythm and normal heart sounds.  No murmur heard. Pulmonary/Chest: Effort normal and breath sounds normal. Right breast exhibits no mass, no nipple discharge, no skin change and no tenderness. Left breast exhibits no mass, no nipple discharge, no skin change and no tenderness.  Abdominal: Soft. There is no tenderness. There is no guarding.  Musculoskeletal: Normal range of motion.  Neurological: She is alert and oriented to person, place, and time. No cranial nerve deficit.  Psychiatric: She has a normal mood and affect. Her behavior is normal.  Vitals  reviewed.   Assessment/Plan:  Encounter for annual routine gynecological examination  Screening for breast cancer - Pt current on mammo  Hormone replacement therapy (HRT) - Rx RF to mail order. Doing well.  - Plan: progesterone (PROMETRIUM) 100 MG capsule, estradiol (ESTRACE) 0.5 MG tablet  Vasomotor symptoms due to menopause - Plan: progesterone (PROMETRIUM) 100 MG capsule, estradiol (ESTRACE) 0.5 MG tablet   Meds ordered this encounter  Medications  . progesterone (PROMETRIUM) 100 MG capsule    Sig: TAKE 1 CAPSULE AT BEDTIME, 6 NIGHTS ON 1 NIGHT OFF    Dispense:  90 capsule    Refill:  3    Order Specific Question:   Supervising Provider    Answer:   Gae Dry [id number]  . estradiol (ESTRACE) 0.5 MG tablet    Sig: Take 1 tablet (0.5 mg total) by mouth daily.    Dispense:  90 tablet    Refill:  3    Order Specific Question:   Supervising Provider    Answer:   Gae Dry [[id number]]            GYN counsel breast self exam, mammography screening, menopause, adequate intake of calcium and vitamin D, diet and exercise    F/U  Return in about 1 year (around 04/29/2019).  Davidlee Jeanbaptiste B. Pleasant Bensinger, PA-C 04/28/2018 8:50 AM

## 2018-04-28 ENCOUNTER — Encounter: Payer: Self-pay | Admitting: Obstetrics and Gynecology

## 2018-04-28 ENCOUNTER — Ambulatory Visit (INDEPENDENT_AMBULATORY_CARE_PROVIDER_SITE_OTHER): Payer: Managed Care, Other (non HMO) | Admitting: Obstetrics and Gynecology

## 2018-04-28 VITALS — BP 122/70 | HR 86 | Ht 64.0 in | Wt 186.0 lb

## 2018-04-28 DIAGNOSIS — Z1239 Encounter for other screening for malignant neoplasm of breast: Secondary | ICD-10-CM

## 2018-04-28 DIAGNOSIS — Z79890 Hormone replacement therapy (postmenopausal): Secondary | ICD-10-CM

## 2018-04-28 DIAGNOSIS — Z01419 Encounter for gynecological examination (general) (routine) without abnormal findings: Secondary | ICD-10-CM | POA: Diagnosis not present

## 2018-04-28 DIAGNOSIS — N951 Menopausal and female climacteric states: Secondary | ICD-10-CM

## 2018-04-28 MED ORDER — ESTRADIOL 0.5 MG PO TABS: 0.5000 mg | ORAL_TABLET | Freq: Every day | ORAL | 3 refills | Status: DC

## 2018-04-28 MED ORDER — PROGESTERONE MICRONIZED 100 MG PO CAPS: ORAL_CAPSULE | ORAL | 3 refills | Status: DC

## 2018-04-28 NOTE — Patient Instructions (Signed)
I value your feedback and entrusting us with your care. If you get a [facility] patient survey, I would appreciate you taking the time to let us know about your experience today. Thank you! 

## 2018-06-24 ENCOUNTER — Telehealth: Payer: Self-pay

## 2018-06-24 NOTE — Telephone Encounter (Signed)
Pt called a week ago c no response.  Needs refill of estradiol from [facility].  Is unable to order it on line.  She is out of it. [phone]  Called [facility], spoke to [place] who explained their process which is:  When they get a new rx if it'd due to be filled in less than 2wks they fill it.  If longer than 2w the put it on 'hold' and the pt needs to call to get it ordered.  Then the rest of the year to pt can order on line.  This was explained to pt.  Also they will send pt email when it is shipped.

## 2019-03-30 ENCOUNTER — Encounter: Payer: Self-pay | Admitting: Emergency Medicine

## 2019-03-30 ENCOUNTER — Emergency Department: Payer: Managed Care, Other (non HMO)

## 2019-03-30 ENCOUNTER — Other Ambulatory Visit: Payer: Self-pay

## 2019-03-30 ENCOUNTER — Emergency Department
Admission: EM | Admit: 2019-03-30 | Discharge: 2019-03-30 | Disposition: A | Discharge status: Home / Self Care | Payer: Managed Care, Other (non HMO) | Attending: Emergency Medicine | Admitting: Emergency Medicine

## 2019-03-30 DIAGNOSIS — N200 Calculus of kidney: Secondary | ICD-10-CM | POA: Diagnosis not present

## 2019-03-30 DIAGNOSIS — Z79899 Other long term (current) drug therapy: Secondary | ICD-10-CM | POA: Diagnosis not present

## 2019-03-30 DIAGNOSIS — R109 Unspecified abdominal pain: Secondary | ICD-10-CM | POA: Diagnosis present

## 2019-03-30 HISTORY — DX: Calculus of kidney: N20.0

## 2019-03-30 LAB — URINALYSIS, COMPLETE (UACMP) WITH MICROSCOPIC
Bilirubin Urine: NEGATIVE
Glucose, UA: NEGATIVE mg/dL
Ketones, ur: NEGATIVE mg/dL
Leukocytes,Ua: NEGATIVE
Nitrite: NEGATIVE
Protein, ur: NEGATIVE mg/dL
Specific Gravity, Urine: 1.018 (ref 1.005–1.030)
pH: 8 (ref 5.0–8.0)

## 2019-03-30 LAB — BASIC METABOLIC PANEL
Anion gap: 10 (ref 5–15)
BUN: 16 mg/dL (ref 6–20)
CO2: 23 mmol/L (ref 22–32)
Calcium: 9.3 mg/dL (ref 8.9–10.3)
Chloride: 106 mmol/L (ref 98–111)
Creatinine, Ser: 0.87 mg/dL (ref 0.44–1.00)
GFR calc Af Amer: >60 mL/min (ref >60)
GFR calc non Af Amer: >60 mL/min (ref >60)
Glucose, Bld: 114 mg/dL — ABNORMAL HIGH (ref 70–99)
Potassium: 4.8 mmol/L (ref 3.5–5.1)
Sodium: 139 mmol/L (ref 135–145)

## 2019-03-30 LAB — CBC
HCT: 43.1 % (ref 36.0–46.0)
Hemoglobin: 14.5 g/dL (ref 12.0–15.0)
MCH: 30.3 pg (ref 26.0–34.0)
MCHC: 33.6 g/dL (ref 30.0–36.0)
MCV: 90 fL (ref 80.0–100.0)
Platelets: 276 10*3/uL (ref 150–400)
RBC: 4.79 MIL/uL (ref 3.87–5.11)
RDW: 12.2 % (ref 11.5–15.5)
WBC: 7.6 10*3/uL (ref 4.0–10.5)
nRBC: 0 % (ref 0.0–0.2)

## 2019-03-30 LAB — HEPATIC FUNCTION PANEL
ALT: 10 U/L (ref 0–44)
AST: 25 U/L (ref 15–41)
Albumin: 4.4 g/dL (ref 3.5–5.0)
Alkaline Phosphatase: 62 U/L (ref 38–126)
Bilirubin, Direct: 0.3 mg/dL — ABNORMAL HIGH (ref 0.0–0.2)
Indirect Bilirubin: 0.5 mg/dL (ref 0.3–0.9)
Total Bilirubin: 0.8 mg/dL (ref 0.3–1.2)
Total Protein: 8 g/dL (ref 6.5–8.1)

## 2019-03-30 MED ORDER — ONDANSETRON 4 MG PO TBDP: 4.0000 mg | ORAL_TABLET | Freq: Three times a day (TID) | ORAL | 0 refills | Status: DC | PRN

## 2019-03-30 MED ORDER — ONDANSETRON HCL 4 MG/2ML IJ SOLN
4.0000 mg | Freq: Once | INTRAMUSCULAR | Status: AC
  Administered 2019-03-30: 11:00:00 4 mg via INTRAVENOUS
  Filled 2019-03-30: qty 2

## 2019-03-30 MED ORDER — KETOROLAC TROMETHAMINE 30 MG/ML IJ SOLN
30.0000 mg | Freq: Once | INTRAMUSCULAR | Status: AC
  Administered 2019-03-30: 30 mg via INTRAVENOUS

## 2019-03-30 MED ORDER — FENTANYL CITRATE (PF) 100 MCG/2ML IJ SOLN
50.0000 ug | Freq: Once | INTRAMUSCULAR | Status: AC
  Administered 2019-03-30: 100 ug via INTRAVENOUS
  Filled 2019-03-30: qty 2

## 2019-03-30 MED ORDER — TAMSULOSIN HCL 0.4 MG PO CAPS
0.4000 mg | ORAL_CAPSULE | Freq: Every day | ORAL | Status: DC
  Administered 2019-03-30: 0.4 mg via ORAL

## 2019-03-30 MED ORDER — KETOROLAC TROMETHAMINE 30 MG/ML IJ SOLN
30.0000 mg | Freq: Once | INTRAMUSCULAR | Status: DC
  Filled 2019-03-30: qty 1

## 2019-03-30 MED ORDER — TAMSULOSIN HCL 0.4 MG PO CAPS: 0.4000 mg | ORAL_CAPSULE | Freq: Every day | ORAL | 0 refills | Status: DC

## 2019-03-30 MED ORDER — SODIUM CHLORIDE 0.9 % IV SOLN
Freq: Once | INTRAVENOUS | Status: AC
  Administered 2019-03-30: 13:00:00 via INTRAVENOUS

## 2019-03-30 MED ORDER — OXYCODONE-ACETAMINOPHEN 5-325 MG PO TABS: 1.0000 | ORAL_TABLET | ORAL | 0 refills | Status: DC | PRN

## 2019-03-30 NOTE — ED Notes (Signed)
Pt assisted to the BR. Pt reports medication helped pain. Currently 5/10.

## 2019-03-30 NOTE — ED Provider Notes (Signed)
[facility] Emergency Department Provider Note  ____________________________________________   First MD Initiated Contact with Patient 03/30/19 1202     (approximate)  I have reviewed the triage vital signs and the nursing notes.   HISTORY  Chief Complaint Flank Pain   HPI Lori Morris is a 58 y.o. female with below list of previous medical conditions including kidney stone 20 years ago presents emergency department secondary to right flank pain began at 8:30 AM this morning.  Patient denies any dysuria.  Patient denies any fever.  Patient denies any nausea or vomiting.  Patient was given fentanyl on arrival to the emergency department and states that while in improved.  Briefly it has now recurred with her current pain score of 8 out of 10.        Past Medical History:  Diagnosis Date  . Kidney stones   . Restless leg syndrome   . Thyroid disease     Patient Active Problem List   Diagnosis Date Noted  . Vasomotor symptoms due to menopause 04/28/2018    Past Surgical History:  Procedure Laterality Date  . ABDOMINAL HYSTERECTOMY    . COLONOSCOPY  2014   wnl repeat 10 yrs  . SUPRACERVICAL ABDOMINAL HYSTERECTOMY  2011   pelvic pain and leio    Prior to Admission medications   Medication Sig Start Date End Date Taking? Authorizing Provider  cetirizine (ZYRTEC) 10 MG chewable tablet Chew 10 mg by mouth daily.    [provider]  cholecalciferol (VITAMIN D) 1000 UNITS tablet Take 1,000 Units by mouth daily.    [provider]  desonide (DESOWEN) 0.05 % lotion  03/19/18   [provider]  doxycycline (ORACEA) 40 MG capsule Take 40 mg by mouth every morning.    [provider]  estradiol (ESTRACE) 0.5 MG tablet Take 1 tablet (0.5 mg total) by mouth daily. 123456   Copland, Deirdre Evener, PA-C  levothyroxine (SYNTHROID, LEVOTHROID) 25 MCG tablet Take by mouth. 04/21/18   [provider]  pantoprazole  (PROTONIX) 20 MG tablet Take 20 mg by mouth daily.    [provider]  progesterone (PROMETRIUM) 100 MG capsule TAKE 1 CAPSULE AT BEDTIME, 6 NIGHTS ON 1 NIGHT OFF 123456   Copland, Alicia B, PA-C  rOPINIRole (REQUIP) 0.25 MG tablet Take by mouth. 02/28/17   [provider]  venlafaxine (EFFEXOR) 37.5 MG tablet Take 37.5 mg by mouth 2 (two) times daily.    [provider]    Allergies Erythromycin and Neosporin [neomycin-bacitracin zn-polymyx]  Family History  Problem Relation Age of Onset  . Hypothyroidism Mother   . Heart disease Father   . Hyperlipidemia Father   . Heart disease Sister   . Breast cancer Neg Hx     Social History Social History   Tobacco Use  . Smoking status: Never Smoker  . Smokeless tobacco: Never Used  Substance Use Topics  . Alcohol use: No  . Drug use: No    Review of Systems Constitutional: No fever/chills Eyes: No visual changes. ENT: No sore throat. Cardiovascular: Denies chest pain. Respiratory: Denies shortness of breath. Gastrointestinal: Positive for right flank pain no nausea, no vomiting.  No diarrhea.  No constipation. Genitourinary: Negative for dysuria. Musculoskeletal: Negative for neck pain.  Negative for back pain. Integumentary: Negative for rash. Neurological: Negative for headaches, focal weakness or numbness.  ____________________________________________   PHYSICAL EXAM:  VITAL SIGNS: ED Triage Vitals  Enc Vitals Group  BP 03/30/19 1036 135/77     Pulse Rate 03/30/19 1036 92     Resp 03/30/19 1036 20     Temp 03/30/19 1038 97.8 F (36.6 C)     Temp Source 03/30/19 1038 Oral     SpO2 03/30/19 1036 100 %     Weight 03/30/19 1036 74.8 kg (165 lb)     Height 03/30/19 1036 1.626 m (5\' 4" )     Head Circumference --      Peak Flow --      Pain Score 03/30/19 1036 10     Pain Loc --      Pain Edu? --      Excl. in [place]? --     Constitutional: Alert and oriented.  Eyes: Conjunctivae are  normal.  Mouth/Throat: Patient is wearing a mask. Neck: No stridor.  No meningeal signs.   Cardiovascular: Normal rate, regular rhythm. Good peripheral circulation. Grossly normal heart sounds. Respiratory: Normal respiratory effort.  No retractions. Gastrointestinal: Soft and nontender. No distention.  Musculoskeletal: No lower extremity tenderness nor edema. No gross deformities of extremities. Neurologic:  Normal speech and language. No gross focal neurologic deficits are appreciated.  Skin:  Skin is warm, dry and intact. Psychiatric: Mood and affect are normal. Speech and behavior are normal.  ____________________________________________   LABS (all labs ordered are listed, but only abnormal results are displayed)  Labs Reviewed  URINALYSIS, COMPLETE (UACMP) WITH MICROSCOPIC - Abnormal; Notable for the following components:      Result Value   Color, Urine YELLOW (*)    APPearance CLEAR (*)    Hgb urine dipstick SMALL (*)    Bacteria, UA RARE (*)    All other components within normal limits  BASIC METABOLIC PANEL - Abnormal; Notable for the following components:   Glucose, Bld 114 (*)    All other components within normal limits  HEPATIC FUNCTION PANEL - Abnormal; Notable for the following components:   Bilirubin, Direct 0.3 (*)    All other components within normal limits  CBC    ___________________________________  RADIOLOGY I, [place] Jakai Risse, personally viewed and evaluated these images (plain radiographs) as part of my medical decision making, as well as reviewing the written report by the radiologist.  ED MD interpretation: 2.6 mm right UVJ stone noted on CT per radiologist.  Also incidental finding of "possible cholelithiasis per radiologist.  Official radiology report(s): Ct Renal Stone Study  Result Date: 03/30/2019 CLINICAL DATA:  Sudden onset of right flank pain this morning. History of kidney stones. EXAM: CT ABDOMEN AND PELVIS WITHOUT CONTRAST TECHNIQUE:  Multidetector CT imaging of the abdomen and pelvis was performed following the standard protocol without IV contrast. COMPARISON:  None. FINDINGS: Lower chest: Normal. Hepatobiliary: Multiple benign-appearing cysts in the right lobe of the liver, the largest being 2.2 cm in the dome of the right lobe. There is inhomogeneous density of the gallbladder suggestive of gallstones. There is no gallbladder wall thickening or pericholecystic fluid or dilatation of the bile ducts. Pancreas: Unremarkable. No pancreatic ductal dilatation or surrounding inflammatory changes. Spleen: Normal in size without focal abnormality. Adrenals/Urinary Tract: There is a 2.6 mm stone in the right ureterovesical junction with slight dilatation of the right ureter. There are no renal calculi. Adrenal glands are normal. Left kidney appears normal. Bladder is normal. Stomach/Bowel: Stomach is within normal limits. Appendix appears normal. No evidence of bowel wall thickening, distention, or inflammatory changes. Vascular/Lymphatic: No significant vascular findings are present. No enlarged abdominal or  pelvic lymph nodes. Reproductive: 19 mm cyst on the left ovary. Supracervical hysterectomy. Normal right ovary. Other: No abdominal wall hernia or abnormality. No abdominopelvic ascites. Musculoskeletal: No acute or significant osseous findings. IMPRESSION: 1. 2.6 mm stone in the right ureterovesical junction causing slight dilatation of the right ureter. 2. Multiple benign-appearing cysts in the liver. 3. Possible cholelithiasis. Electronically Signed   By: Lorriane Shire M.D.   On: 03/30/2019 12:15      Procedures   ____________________________________________   INITIAL IMPRESSION / MDM / ASSESSMENT AND PLAN / ED COURSE  As part of my medical decision making, I reviewed the following data within the electronic MEDICAL RECORD NUMBER  58 year old female presented with above-stated history and physical exam concerning for possible  cholelithiasis versus ureterolithiasis.  CT scan consistent with both however I suspect that the patient's pain today secondary to the 2.6 mm stone noted at the UVJ.  Patient given Toradol 30 mg IV 1 L IV normal saline and Flomax 0.4 mg.  On reevaluation patient's pain completely resolved.  Patient will be prescribed Percocet Zofran and Flomax for home.  Patient referred to Dr. Erlene Quan urology     ____________________________________________  FINAL CLINICAL IMPRESSION(S) / ED DIAGNOSES  Final diagnoses:  Right kidney stone     MEDICATIONS GIVEN DURING THIS VISIT:  Medications  ketorolac (TORADOL) 30 MG/ML injection 30 mg (has no administration in time range)  0.9 %  sodium chloride infusion (has no administration in time range)  ketorolac (TORADOL) 30 MG/ML injection 30 mg (has no administration in time range)  tamsulosin (FLOMAX) capsule 0.4 mg (has no administration in time range)  fentaNYL (SUBLIMAZE) injection 50 mcg (100 mcg Intravenous Given 03/30/19 1047)  ondansetron (ZOFRAN) injection 4 mg (4 mg Intravenous Given 03/30/19 1047)     ED Discharge Orders    None      *Please note:  NOELLY THROWER was evaluated in Emergency Department on 03/30/2019 for the symptoms described in the history of present illness. She was evaluated in the context of the global COVID-19 pandemic, which necessitated consideration that the patient might be at risk for infection with the SARS-CoV-2 virus that causes COVID-19. Institutional protocols and algorithms that pertain to the evaluation of patients at risk for COVID-19 are in a state of rapid change based on information released by regulatory bodies including the CDC and federal and state organizations. These policies and algorithms were followed during the patient's care in the ED.  Some ED evaluations and interventions may be delayed as a result of limited staffing during the pandemic.*  Note:  This document was prepared using Dragon voice  recognition software and may include unintentional dictation errors.   Gregor Hams, MD 03/30/19 [phone]

## 2019-03-30 NOTE — ED Notes (Signed)
Pt back from CT

## 2019-03-30 NOTE — ED Triage Notes (Signed)
Pt in via EMS from home with c/o right sided flank pain that started suddenly this am.  116/64, HR 98, pain 10/10. Hx of Kidney stones x/s 15 years ago. Denies urinary sx's.

## 2019-03-30 NOTE — ED Triage Notes (Signed)
Pt reports sudden onset of right sided flank pain about 8:30 this am. Pt reports pain radiates around to her RLQ. Pt nauseated in triage and appears uncomfortable.

## 2019-04-28 ENCOUNTER — Encounter

## 2019-04-28 ENCOUNTER — Ambulatory Visit: Payer: Self-pay | Admitting: Urology

## 2019-06-10 IMAGING — MG DIGITAL DIAGNOSTIC BILATERAL MAMMOGRAM WITH TOMO AND CAD
8 series · 8 of 24 positions shown · non-contrast
Comparison: Previous exam(s).

CLINICAL DATA: 56-year-old patient presents for annual examination
and follow-up of probably benign findings in the left breast.

EXAM:
DIGITAL DIAGNOSTIC BILATERAL MAMMOGRAM WITH CAD AND TOMO
ULTRASOUND LEFT BREAST

[L MLO synth-2D]
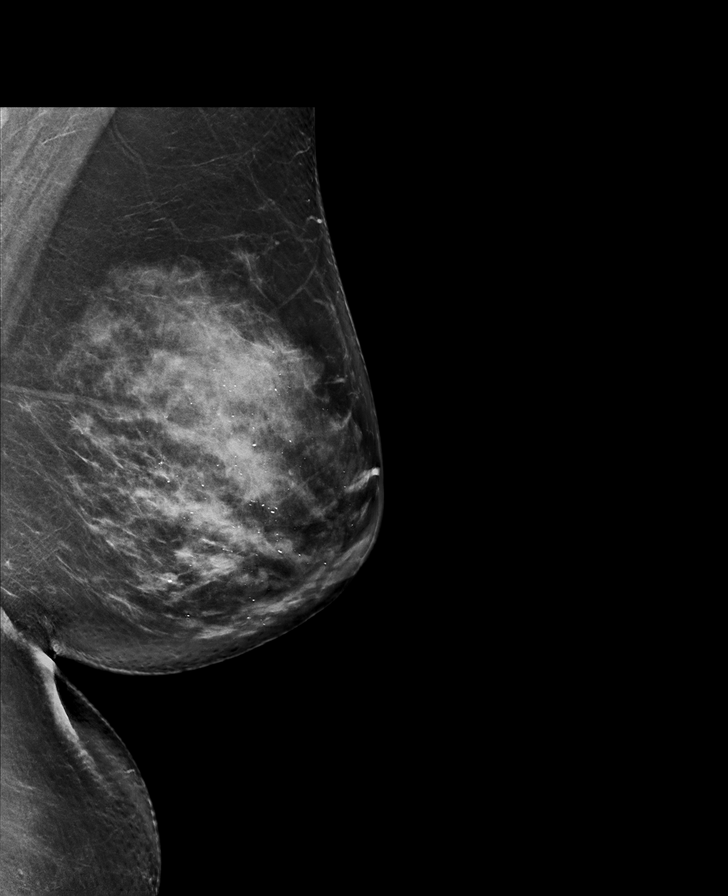

[R MLO synth-2D]
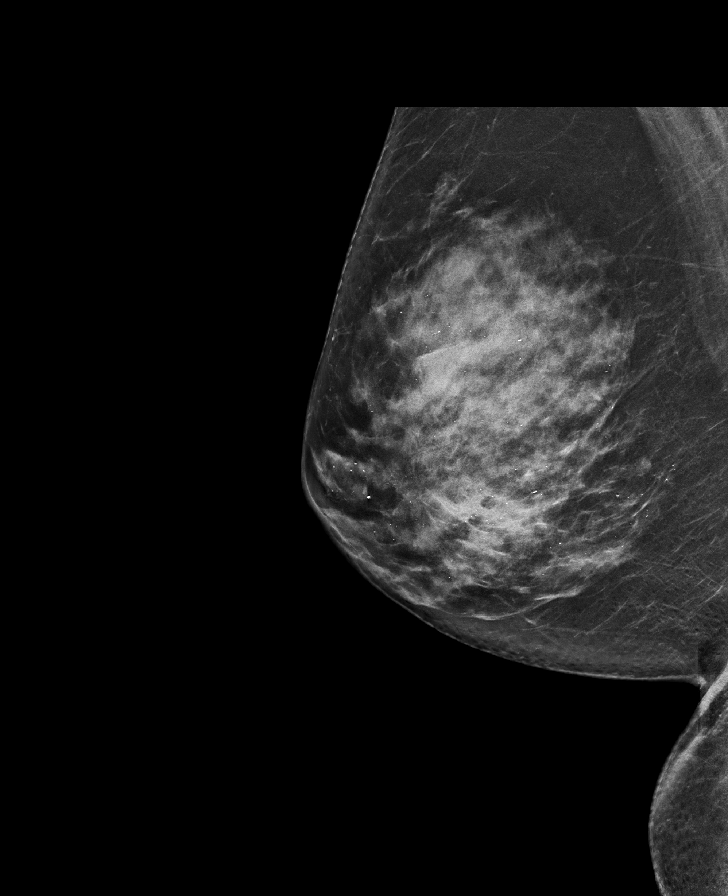

[R CC synth-2D]
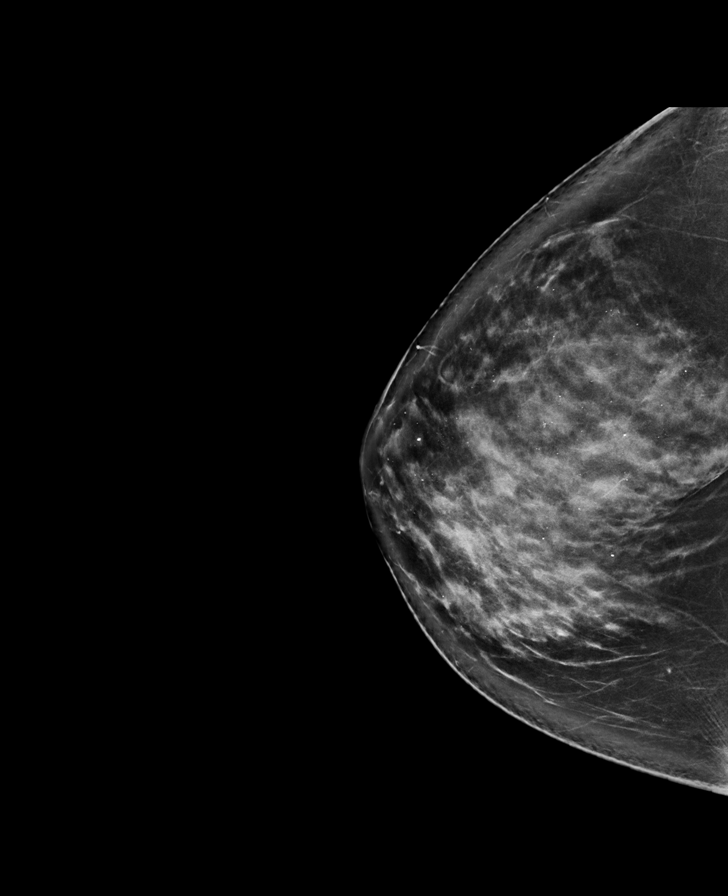

[L CC synth-2D]
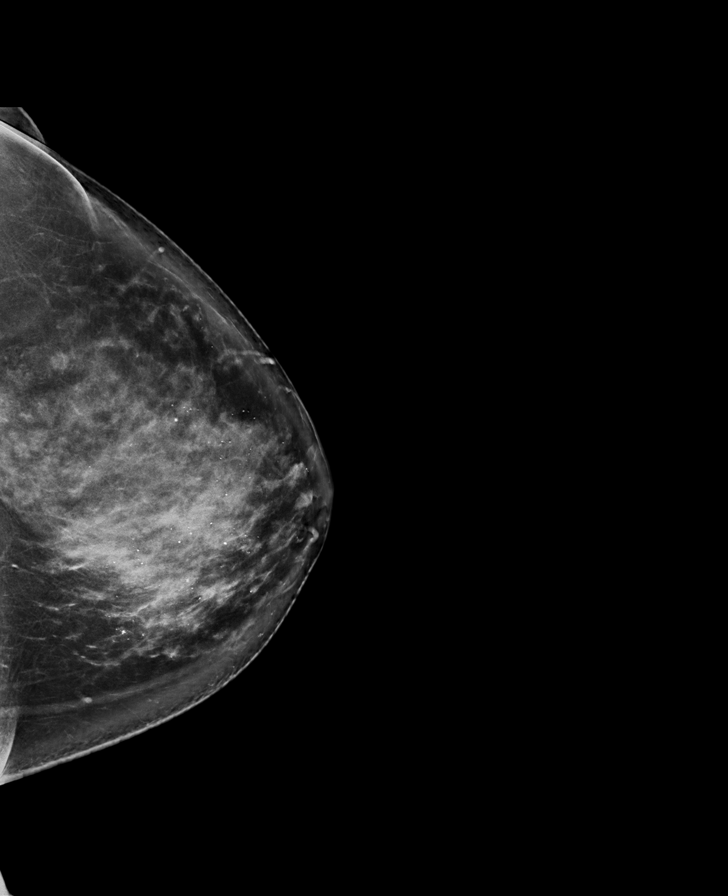

[R MLO tomo · tomo slice 46/91.0]
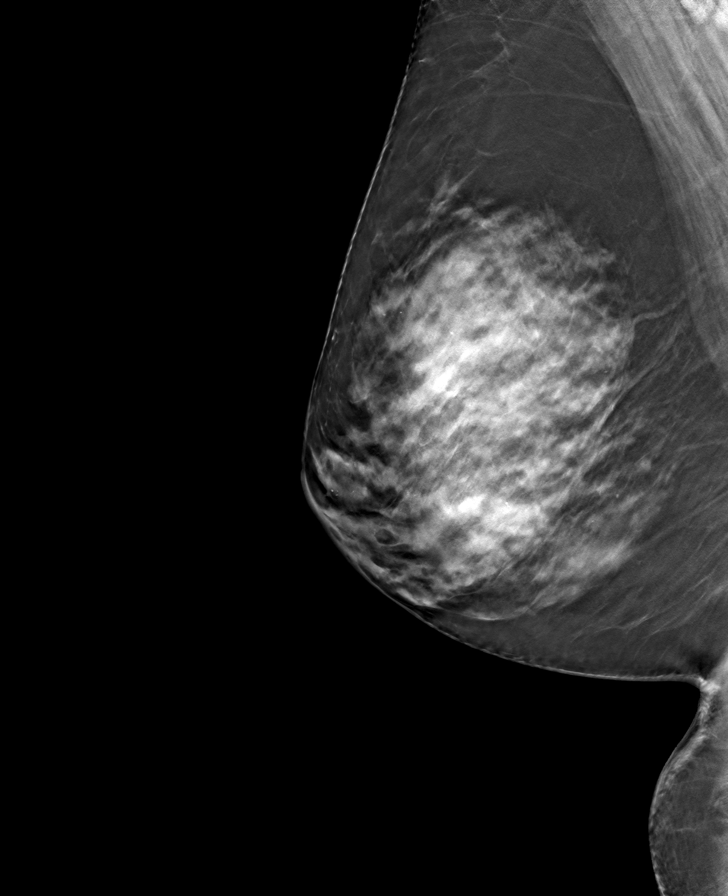

[L MLO tomo · tomo slice 49/97.0]
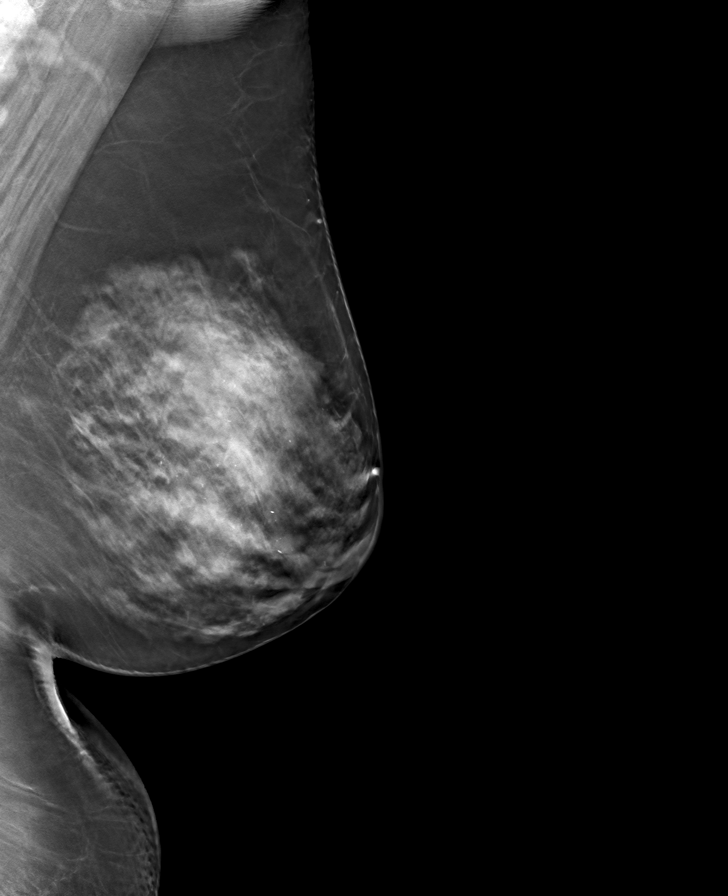

[R CC tomo · tomo slice 48/95.0]
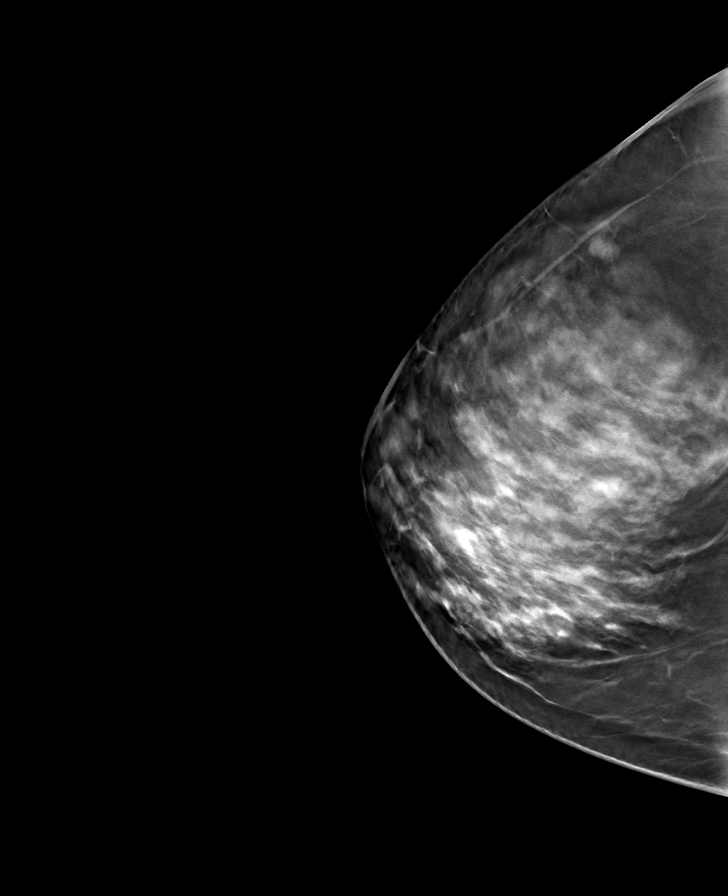

[L CC tomo · tomo slice 49/98.0]
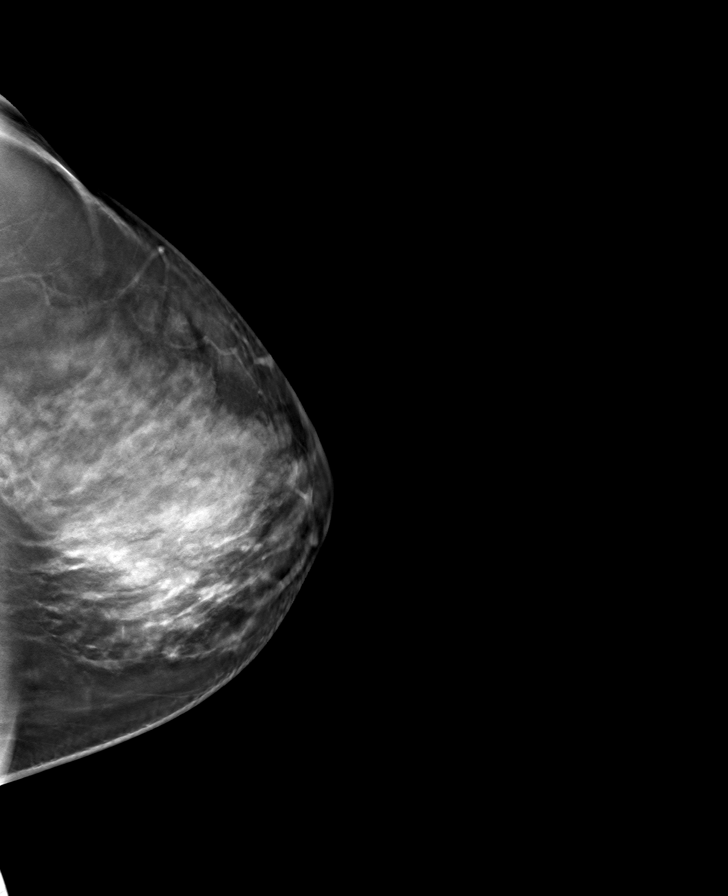

[8 of 24 positions shown; findings below may reference images not displayed]

ACR Breast Density Category c: The breast tissue is heterogeneously
dense, which may obscure small masses.
FINDINGS: No suspicious mass, architectural distortion, or suspicious
microcalcification is identified in either breast to suggest
malignancy.

Mammographic images were processed with CAD.

Targeted ultrasound is performed, showing a circumscribed oval
nearly anechoic mass deep in the left breast 3 o'clock position 4 cm
from the nipple measuring 0.5 x 0.5 x 0.4 cm. This is stable to
slightly smaller and consistent with a mildly complicated cyst.
Re-evaluation of the 11 o'clock position of the left breast 5 cm
from the nipple shows a normal appearing fat lobule.
IMPRESSION: No evidence of malignancy in either breast.

RECOMMENDATION:
Screening mammogram in one year.(Code:[id number])

I have discussed the findings and recommendations with the patient.
Results were also provided in writing at the conclusion of the
visit. If applicable, a reminder letter will be sent to the patient
regarding the next appointment.

BI-RADS CATEGORY  2: Benign.

## 2019-07-04 ENCOUNTER — Other Ambulatory Visit: Payer: Self-pay | Admitting: Obstetrics and Gynecology

## 2019-07-04 DIAGNOSIS — Z79890 Hormone replacement therapy (postmenopausal): Secondary | ICD-10-CM

## 2019-07-04 DIAGNOSIS — N951 Menopausal and female climacteric states: Secondary | ICD-10-CM

## 2019-07-13 ENCOUNTER — Other Ambulatory Visit: Payer: Self-pay | Admitting: Internal Medicine

## 2019-07-13 DIAGNOSIS — Z1231 Encounter for screening mammogram for malignant neoplasm of breast: Secondary | ICD-10-CM

## 2019-08-16 NOTE — Progress Notes (Signed)
PCP: Idelle Crouch, MD   Chief Complaint  Patient presents with  . Gynecologic Exam    HPI:      [name] is a 59 y.o. No obstetric history on file. who LMP was No LMP recorded. Patient has had a hysterectomy., presents today for her annual examination.  Her menses are absent due to menopause/lap supracx hyst due to leio and pelvic pain. She does not have intermenstrual bleeding.  She does not have vasomotor sx if she takes HRT. She uses estradiol 0.5 mg daily and prometrium 100 mg 6 nights on, 1 night off with sx control. Sx recur if she misses a dose.   Sex activity: single partner, contraception - post menopausal status. She does not have vaginal dryness.  Last Pap:04/17/17 Results were: no abnormalities /neg HPV DNA.  Hx of STDs: none  Last mammogram: 03/24/18  Results were: normal. Repeat in 12 months. Order placed by PCP 2021 There is no FH of breast cancer. There is no FH of ovarian cancer. The patient does not do self-breast exams.  Colonoscopy: colonoscopy 2014 without abnormalities.  Repeat due after 10 years.  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 Alcohol use: none  No drug use Exercise: not active  She does get adequate calcium and Vitamin D in her diet.  Labs with PCP.  Past Medical History:  Diagnosis Date  . Kidney stones   . Restless leg syndrome   . Thyroid disease     Past Surgical History:  Procedure Laterality Date  . ABDOMINAL HYSTERECTOMY    . COLONOSCOPY  2014   wnl repeat 10 yrs  . SUPRACERVICAL ABDOMINAL HYSTERECTOMY  2011   pelvic pain and leio    Family History  Problem Relation Age of Onset  . Hypothyroidism Mother   . Heart disease Father   . Hyperlipidemia Father   . Heart disease Sister   . Breast cancer Neg Hx     Social History   Socioeconomic History  . Marital status: Widowed    Spouse name: Not on file  . Number of children:  Not on file  . Years of education: Not on file  . Highest education level: Not on file  Occupational History  . Not on file  Tobacco Use  . Smoking status: Never Smoker  . Smokeless tobacco: Never Used  Substance and Sexual Activity  . Alcohol use: No  . Drug use: No  . Sexual activity: Yes    Partners: Male    Birth control/protection: Surgical    Comment: Hysterectomy  Other Topics Concern  . Not on file  Social History Narrative  . Not on file   Social Determinants of Health   Financial Resource Strain:   . Difficulty of Paying Living Expenses:   Food Insecurity:   . Worried About Charity fundraiser in the Last Year:   . Arboriculturist in the Last Year:   Transportation Needs:   . Film/video editor (Medical):   Marland Kitchen Lack of Transportation (Non-Medical):   Physical Activity:   . Days of Exercise per Week:   . Minutes of Exercise per Session:   Stress:   . Feeling of Stress :   Social Connections:   . Frequency of Communication with Friends and Family:   . Frequency of Social Gatherings with Friends and Family:   . Attends Religious Services:   .  Active Member of Clubs or Organizations:   . Attends Archivist Meetings:   Marland Kitchen Marital Status:   Intimate Partner Violence:   . Fear of Current or Ex-Partner:   . Emotionally Abused:   Marland Kitchen Physically Abused:   . Sexually Abused:     Current Meds  Medication Sig  . ALPRAZolam (XANAX) 0.25 MG tablet Take 0.25 mg by mouth 2 (two) times daily as needed.  . cetirizine (ZYRTEC) 10 MG chewable tablet Chew 10 mg by mouth daily.  . cholecalciferol (VITAMIN D) 1000 UNITS tablet Take 1,000 Units by mouth daily.  Marland Kitchen desonide (DESOWEN) 0.05 % lotion   . doxycycline (ORACEA) 40 MG capsule Take 40 mg by mouth every morning.  . hydrochlorothiazide (HYDRODIURIL) 25 MG tablet Take by mouth.  . levothyroxine (SYNTHROID, LEVOTHROID) 25 MCG tablet Take by mouth.  . methocarbamol (ROBAXIN) 500 MG tablet Take 500 mg by mouth 3  (three) times daily as needed.  . pantoprazole (PROTONIX) 20 MG tablet Take 20 mg by mouth daily.  Marland Kitchen rOPINIRole (REQUIP) 0.25 MG tablet Take by mouth.  . venlafaxine XR (EFFEXOR-XR) 37.5 MG 24 hr capsule Take by mouth.  . [DISCONTINUED] estradiol (ESTRACE) 0.5 MG tablet Take 1 tablet (0.5 mg total) by mouth daily.  . [DISCONTINUED] progesterone (PROMETRIUM) 100 MG capsule TAKE 1 CAPSULE AT BEDTIME, 6 NIGHTS ON 1 NIGHT OFF  . [DISCONTINUED] progesterone (PROMETRIUM) 100 MG capsule TAKE 1 CAPSULE AT BEDTIME, 6 NIGHTS ON 1 NIGHT OFF  . estradiol (ESTRACE) 0.5 MG tablet Take 1 tablet (0.5 mg total) by mouth daily.  . progesterone (PROMETRIUM) 100 MG capsule TAKE 1 CAPSULE AT BEDTIME, 6 NIGHTS ON 1 NIGHT OFF      ROS:  Review of Systems  Constitutional: Negative for fatigue, fever and unexpected weight change.  Respiratory: Negative for cough, shortness of breath and wheezing.   Cardiovascular: Negative for chest pain, palpitations and leg swelling.  Gastrointestinal: Negative for blood in stool, constipation, diarrhea, nausea and vomiting.  Endocrine: Negative for cold intolerance, heat intolerance and polyuria.  Genitourinary: Negative for dyspareunia, dysuria, flank pain, frequency, genital sores, hematuria, menstrual problem, pelvic pain, urgency, vaginal bleeding, vaginal discharge and vaginal pain.  Musculoskeletal: Negative for back pain, joint swelling and myalgias.  Skin: Negative for rash.  Neurological: Negative for dizziness, syncope, light-headedness, numbness and headaches.  Hematological: Negative for adenopathy.  Psychiatric/Behavioral: Positive for agitation. Negative for confusion, sleep disturbance and suicidal ideas. The patient is not nervous/anxious.      Objective: BP 120/80   Ht 5\' 4"  (1.626 m)   Wt 169 lb (76.7 kg)   BMI 29.01 kg/m    Physical Exam Constitutional:      Appearance: She is well-developed.  Genitourinary:     Vulva, vagina, cervix, right  adnexa and left adnexa normal.     No vaginal discharge, erythema or tenderness.     Uterus is absent.     No right or left adnexal mass present.     Right adnexa not tender.     Left adnexa not tender.     Genitourinary Comments: UTERUS SURG REM  Neck:     Thyroid: No thyromegaly.  Cardiovascular:     Rate and Rhythm: Normal rate and regular rhythm.     Heart sounds: Normal heart sounds. No murmur.  Pulmonary:     Effort: Pulmonary effort is normal.     Breath sounds: Normal breath sounds.  Chest:     Breasts:  Right: No mass, nipple discharge, skin change or tenderness.        Left: No mass, nipple discharge, skin change or tenderness.  Abdominal:     Palpations: Abdomen is soft.     Tenderness: There is no abdominal tenderness. There is no guarding.  Musculoskeletal:        General: Normal range of motion.     Cervical back: Normal range of motion.  Neurological:     General: No focal deficit present.     Mental Status: She is alert and oriented to person, place, and time.     Cranial Nerves: No cranial nerve deficit.  Skin:    General: Skin is warm and dry.  Psychiatric:        Mood and Affect: Mood normal.        Behavior: Behavior normal.        Thought Content: Thought content normal.        Judgment: Judgment normal.  Vitals reviewed.     Assessment/Plan:  Encounter for annual routine gynecological examination  Encounter for screening mammogram for malignant neoplasm of breast--pt has mammo order placed by PCP. Plans to sched.  Hormone replacement therapy (HRT) - Plan: estradiol (ESTRACE) 0.5 MG tablet, progesterone (PROMETRIUM) 100 MG capsule, Rx RF to mail order and local pham.   Vasomotor symptoms due to menopause - Plan: estradiol (ESTRACE) 0.5 MG tablet, progesterone (PROMETRIUM) 100 MG capsule,    Meds ordered this encounter  Medications  . DISCONTD: progesterone (PROMETRIUM) 100 MG capsule    Sig: TAKE 1 CAPSULE AT BEDTIME, 6 NIGHTS ON 1  NIGHT OFF    Dispense:  30 capsule    Refill:  0    Order Specific Question:   Supervising Provider    Answer:   Gae Dry [id number]  . estradiol (ESTRACE) 0.5 MG tablet    Sig: Take 1 tablet (0.5 mg total) by mouth daily.    Dispense:  90 tablet    Refill:  3    Order Specific Question:   Supervising Provider    Answer:   Gae Dry [id number]  . progesterone (PROMETRIUM) 100 MG capsule    Sig: TAKE 1 CAPSULE AT BEDTIME, 6 NIGHTS ON 1 NIGHT OFF    Dispense:  90 capsule    Refill:  3    Order Specific Question:   Supervising Provider    Answer:   Gae Dry [id number]            GYN counsel breast self exam, mammography screening, menopause, adequate intake of calcium and vitamin D, diet and exercise    F/U  Return in about 1 year (around 08/16/2020).  Demichael Traum B. Katya Rolston, PA-C 08/17/2019 10:35 AM

## 2019-08-17 ENCOUNTER — Ambulatory Visit: Payer: Managed Care, Other (non HMO) | Admitting: Obstetrics and Gynecology

## 2019-08-17 ENCOUNTER — Encounter: Payer: Self-pay | Admitting: Obstetrics and Gynecology

## 2019-08-17 ENCOUNTER — Ambulatory Visit (INDEPENDENT_AMBULATORY_CARE_PROVIDER_SITE_OTHER): Payer: Managed Care, Other (non HMO) | Admitting: Obstetrics and Gynecology

## 2019-08-17 ENCOUNTER — Other Ambulatory Visit: Payer: Self-pay

## 2019-08-17 VITALS — BP 120/80 | Ht 64.0 in | Wt 169.0 lb

## 2019-08-17 DIAGNOSIS — Z01419 Encounter for gynecological examination (general) (routine) without abnormal findings: Secondary | ICD-10-CM | POA: Diagnosis not present

## 2019-08-17 DIAGNOSIS — N951 Menopausal and female climacteric states: Secondary | ICD-10-CM

## 2019-08-17 DIAGNOSIS — Z1231 Encounter for screening mammogram for malignant neoplasm of breast: Secondary | ICD-10-CM

## 2019-08-17 DIAGNOSIS — Z79890 Hormone replacement therapy (postmenopausal): Secondary | ICD-10-CM

## 2019-08-17 DIAGNOSIS — Z90711 Acquired absence of uterus with remaining cervical stump: Secondary | ICD-10-CM

## 2019-08-17 MED ORDER — PROGESTERONE MICRONIZED 100 MG PO CAPS: ORAL_CAPSULE | ORAL | 0 refills | Status: DC

## 2019-08-17 MED ORDER — PROGESTERONE MICRONIZED 100 MG PO CAPS: ORAL_CAPSULE | ORAL | 3 refills | Status: DC

## 2019-08-17 MED ORDER — ESTRADIOL 0.5 MG PO TABS: 0.5000 mg | ORAL_TABLET | Freq: Every day | ORAL | 3 refills | Status: DC

## 2019-08-17 NOTE — Patient Instructions (Signed)
I value your feedback and entrusting us with your care. If you get a [facility] patient survey, I would appreciate you taking the time to let us know about your experience today. Thank you! ° °As of April 30, 2019, your lab results will be released to your MyChart immediately, before I even have a chance to see them. Please give me time to review them and contact you if there are any abnormalities. Thank you for your patience.  ° °[facility]: [phone] ° ° ° °

## 2019-10-27 ENCOUNTER — Ambulatory Visit (INDEPENDENT_AMBULATORY_CARE_PROVIDER_SITE_OTHER): Payer: Managed Care, Other (non HMO) | Admitting: Dermatology

## 2019-10-27 ENCOUNTER — Other Ambulatory Visit: Payer: Self-pay

## 2019-10-27 DIAGNOSIS — L578 Other skin changes due to chronic exposure to nonionizing radiation: Secondary | ICD-10-CM

## 2019-10-27 DIAGNOSIS — L821 Other seborrheic keratosis: Secondary | ICD-10-CM

## 2019-10-27 DIAGNOSIS — L304 Erythema intertrigo: Secondary | ICD-10-CM | POA: Diagnosis not present

## 2019-10-27 DIAGNOSIS — D229 Melanocytic nevi, unspecified: Secondary | ICD-10-CM

## 2019-10-27 DIAGNOSIS — L820 Inflamed seborrheic keratosis: Secondary | ICD-10-CM

## 2019-10-27 DIAGNOSIS — L814 Other melanin hyperpigmentation: Secondary | ICD-10-CM

## 2019-10-27 DIAGNOSIS — D1801 Hemangioma of skin and subcutaneous tissue: Secondary | ICD-10-CM

## 2019-10-27 DIAGNOSIS — Z1283 Encounter for screening for malignant neoplasm of skin: Secondary | ICD-10-CM | POA: Diagnosis not present

## 2019-10-27 DIAGNOSIS — L219 Seborrheic dermatitis, unspecified: Secondary | ICD-10-CM

## 2019-10-27 DIAGNOSIS — D2271 Melanocytic nevi of right lower limb, including hip: Secondary | ICD-10-CM

## 2019-10-27 MED ORDER — DESONIDE 0.05 % EX LOTN: TOPICAL_LOTION | Freq: Two times a day (BID) | CUTANEOUS | 2 refills | Status: AC

## 2019-10-27 MED ORDER — KETOCONAZOLE 2 % EX CREA: TOPICAL_CREAM | CUTANEOUS | 2 refills | Status: DC

## 2019-10-27 NOTE — Progress Notes (Signed)
   Follow-Up Visit   Subjective  Lori Morris is a 59 y.o. female who presents for the following: Annual Exam.  Patient here today for TBSE. No history of skin cancer. She has noticed some itchy, crusty places in her ears for about 6 months. She has used Desonide which seems to help and spots go away. She has some crusty spots that she tends to pick at on her jaw and thigh.  No other new or changing spots per patient.  The following portions of the chart were reviewed this encounter and updated as appropriate:      Review of Systems:  No other skin or systemic complaints except as noted in HPI or Assessment and Plan.  Objective  Well appearing patient in no apparent distress; mood and affect are within normal limits.  A full examination was performed including scalp, head, eyes, ears, nose, lips, neck, chest, axillae, abdomen, back, buttocks, bilateral upper extremities, bilateral lower extremities, hands, feet, fingers, toes, fingernails, and toenails. All findings within normal limits unless otherwise noted below.  Objective  Left Inframammary: Mild erythema with small pink papules  Objective  Right Mandible, L upper neck, L lat thigh: Erythematous keratotic waxy stuck-on papule.   Objective  Right 3rd Toe: 2.70mm med light brown macule  Objective  Right Ear concha and postauricular crease: Mild erythema and scale    Assessment & Plan  Erythema intertrigo Left Inframammary  Start ketoconazole 2% cream to AA's PRN rash QD/bid  Ordered Medications: ketoconazole (NIZORAL) 2 % cream  Inflamed seborrheic keratosis Right Mandible, L upper neck, L lat thigh  Will schedule for Ln2 in future- pt has job interview tomorrow  Nevus Right 3rd Toe  Benign-appearing.  Observation.  Call clinic for new or changing moles.  Recommend daily use of broad spectrum spf 30+ sunscreen to sun-exposed areas.   Present for years with no change.   Seborrheic dermatitis Right Ear  concha and postauricular crease  Cont Desonide lotion to AA's ears PRN itch.  Ordered Medications: desonide (DESOWEN) 0.05 % lotion   Lentigines - Scattered tan macules - Discussed due to sun exposure - Benign, observe - Call for any changes  Seborrheic Keratoses - Stuck-on, waxy, tan-brown papules and plaques  - Discussed benign etiology and prognosis. - Observe - Call for any changes  Melanocytic Nevi - Tan-brown and/or pink-flesh-colored symmetric macules and papules - Benign appearing on exam today - Observation - Call clinic for new or changing moles - Recommend daily use of broad spectrum spf 30+ sunscreen to sun-exposed areas.   Hemangiomas - Red papules - Discussed benign nature - Observe - Call for any changes  Actinic Damage - diffuse scaly erythematous macules with underlying dyspigmentation - Recommend daily broad spectrum sunscreen SPF 30+ to sun-exposed areas, reapply every 2 hours as needed.  - Call for new or changing lesions.  Skin cancer screening performed today.   Return in about 1 year (around 10/26/2020), or if symptoms worsen or fail to improve, for TBSE, PRN for ISK's before June 28th if available.  Graciella Belton, RMA, am acting as scribe for Brendolyn Patty, MD .  Documentation: I have reviewed the above documentation for accuracy and completeness, and I agree with the above.  Brendolyn Patty MD

## 2019-10-27 NOTE — Patient Instructions (Signed)
Recommend daily broad spectrum sunscreen SPF 30+ to sun-exposed areas, reapply every 2 hours as needed. Call for new or changing lesions.  

## 2019-12-28 ENCOUNTER — Other Ambulatory Visit: Payer: Self-pay | Admitting: Obstetrics and Gynecology

## 2019-12-28 DIAGNOSIS — Z79890 Hormone replacement therapy (postmenopausal): Secondary | ICD-10-CM

## 2019-12-28 DIAGNOSIS — N951 Menopausal and female climacteric states: Secondary | ICD-10-CM

## 2019-12-29 ENCOUNTER — Other Ambulatory Visit: Payer: Self-pay | Admitting: Obstetrics and Gynecology

## 2019-12-29 ENCOUNTER — Telehealth: Payer: Self-pay

## 2019-12-29 DIAGNOSIS — Z79890 Hormone replacement therapy (postmenopausal): Secondary | ICD-10-CM

## 2019-12-29 DIAGNOSIS — N951 Menopausal and female climacteric states: Secondary | ICD-10-CM

## 2019-12-29 MED ORDER — ESTRADIOL 0.5 MG PO TABS: 0.5000 mg | ORAL_TABLET | Freq: Every day | ORAL | 2 refills | Status: DC

## 2019-12-29 MED ORDER — PROGESTERONE MICRONIZED 100 MG PO CAPS: ORAL_CAPSULE | ORAL | 2 refills | Status: DC

## 2019-12-29 NOTE — Telephone Encounter (Signed)
Pt aware.

## 2019-12-29 NOTE — Telephone Encounter (Signed)
Pt calling; needs [facility]xs [facility] [address]; is no longer employed so she doesn't need to use mail order any more.  [phone]  Pharm changed in computer.

## 2019-12-29 NOTE — Progress Notes (Signed)
HRT Rxs refilled to [facility] [place]. No longer using mail order.

## 2019-12-29 NOTE — Telephone Encounter (Signed)
[facility]

## 2020-02-12 ENCOUNTER — Telehealth: Payer: Self-pay

## 2020-02-12 NOTE — Telephone Encounter (Signed)
Pt calling; needs [facility]radiol [facility]ill [facility] [address] is running out; [facility].  [phone]  Called pharm, spoke c Ilona Sorrel who says they will get rx ready for pt; pt has 2 refills after this.  Left detailed msg of this for pt.

## 2020-02-17 ENCOUNTER — Telehealth: Payer: Self-pay

## 2020-02-17 NOTE — Telephone Encounter (Signed)
Pt states that her pharmacy that she has been using to fill the Darla Lesches is closing and the new one that will be filling it has informed her that with her insurance it is going to be $500+/month out of pocket. They have offered her their pt assistance for the medication, but would still be $100+/month out of pocket. Pt is wondering if there is a replacement medication that she can use as the Darla Lesches has really been helping.   [facility] [address] in [place].

## 2020-02-22 ENCOUNTER — Other Ambulatory Visit: Payer: Self-pay

## 2020-02-22 MED ORDER — DOXYCYCLINE 40 MG PO CPDR: 40.0000 mg | DELAYED_RELEASE_CAPSULE | Freq: Every day | ORAL | 4 refills | Status: DC

## 2020-02-22 NOTE — Progress Notes (Signed)
Rx refill to new pharmacy per pt request

## 2020-02-22 NOTE — Telephone Encounter (Signed)
Rx sent to [facility]

## 2020-02-22 NOTE — Telephone Encounter (Signed)
If she wants the extended release version (Oracea 40 mg PO qd #30 4 rfs or generic Oracea), then we can send it to [facility] who applies the manufacturer's coupon and they will mail it to her- it shouldn't be more than $50.  If she wants to try an immediate release version, then can send in doxycycline 20 mg PO bid, #60 4 [facility]

## 2020-02-24 ENCOUNTER — Other Ambulatory Visit: Payer: Self-pay

## 2020-02-24 DIAGNOSIS — L719 Rosacea, unspecified: Secondary | ICD-10-CM

## 2020-02-24 MED ORDER — DOXYCYCLINE 40 MG PO CPDR: 40.0000 mg | DELAYED_RELEASE_CAPSULE | Freq: Every day | ORAL | 4 refills | Status: DC

## 2020-02-24 NOTE — Progress Notes (Signed)
Resent to pharmacy. Pt was told they did not receive e script on 02/22/20.

## 2020-06-15 IMAGING — CT CT RENAL STONE PROTOCOL
2 of 4 series · 15 of 46 positions shown, 17 images · non-contrast
Comparison: None.

CLINICAL DATA: Sudden onset of right flank pain this morning.
History of kidney stones.

EXAM:
CT ABDOMEN AND PELVIS WITHOUT CONTRAST
TECHNIQUE: Multidetector CT imaging of the abdomen and pelvis was performed
following the standard protocol without IV contrast.

[Series 2: stone full standard · axial · 0.64mm/px · z∈[-1058,-674]mm · 12 of 92 slices shown, 14 images]
[im 8/92  soft-tissue]
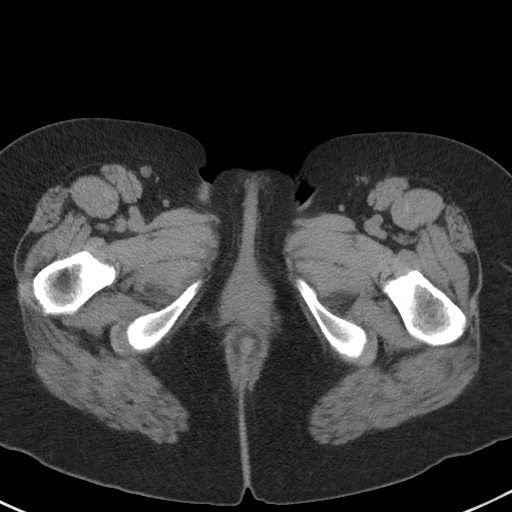
[im 8/92  bone]
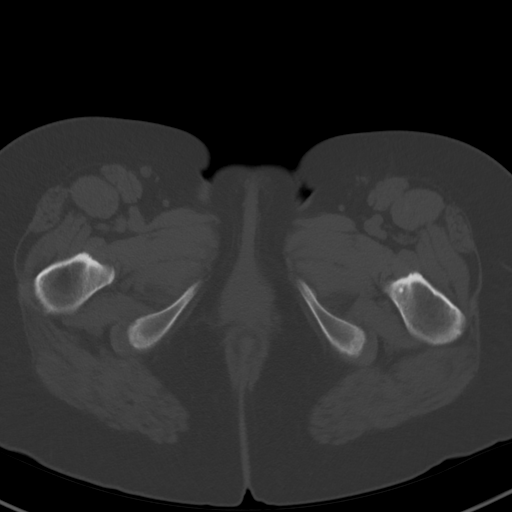
[im 15/92  soft-tissue]
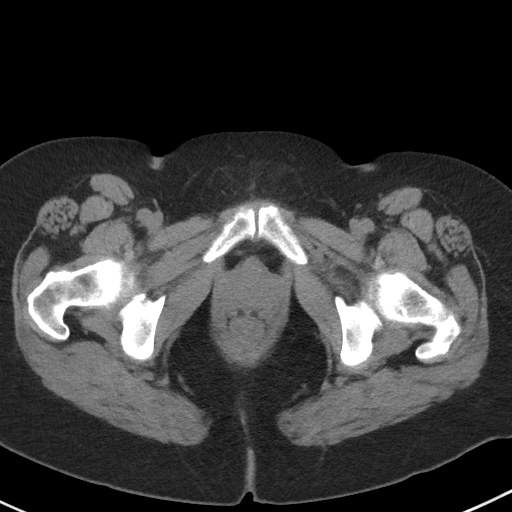
[im 22/92  soft-tissue]
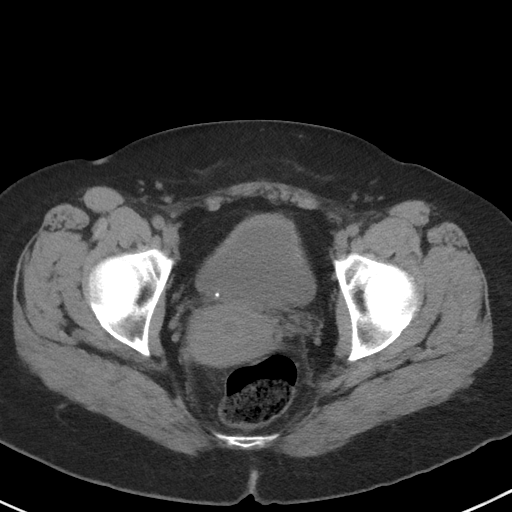
[im 29/92  soft-tissue]
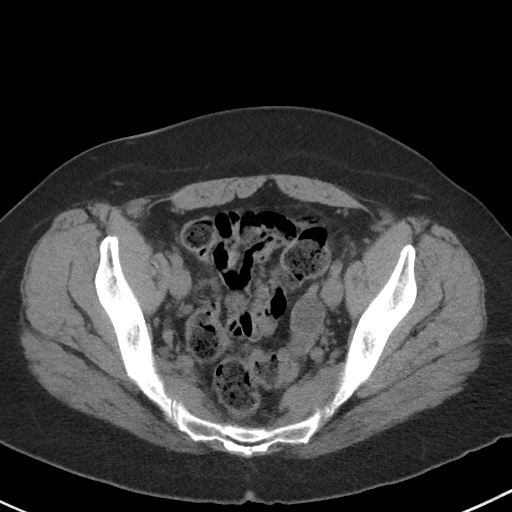
[im 36/92  soft-tissue]
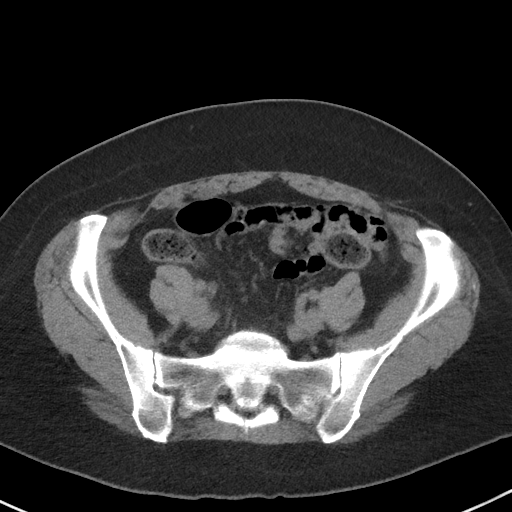
[im 43/92  soft-tissue]
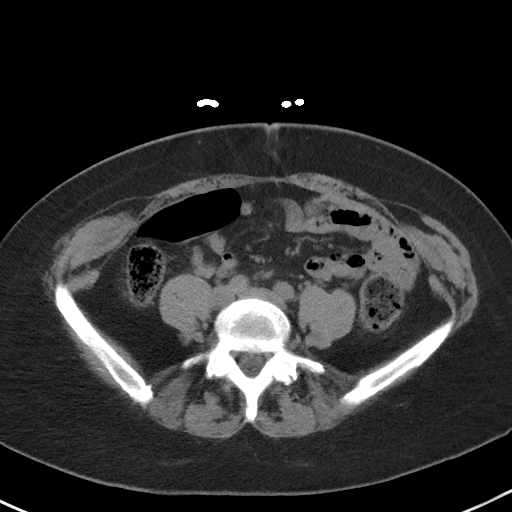
[im 50/92  soft-tissue]
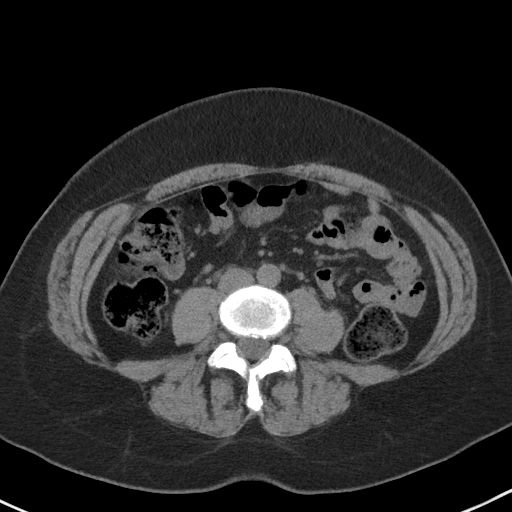
[im 57/92  soft-tissue]
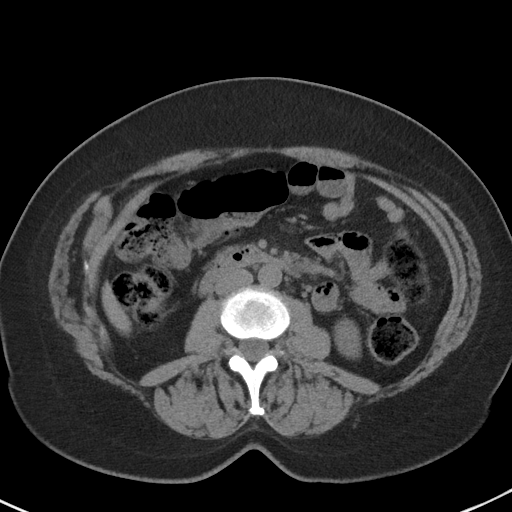
[im 64/92  soft-tissue]
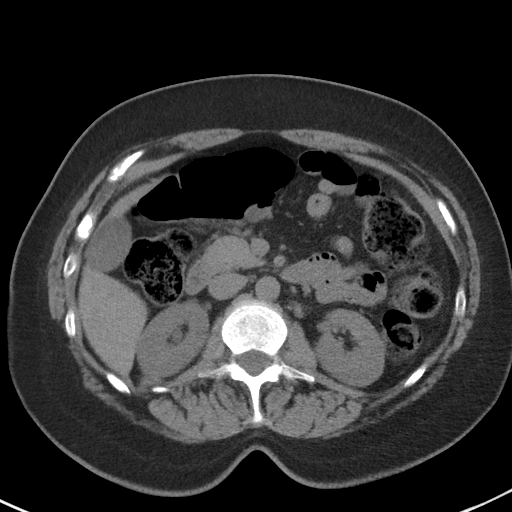
[im 64/92  bone]
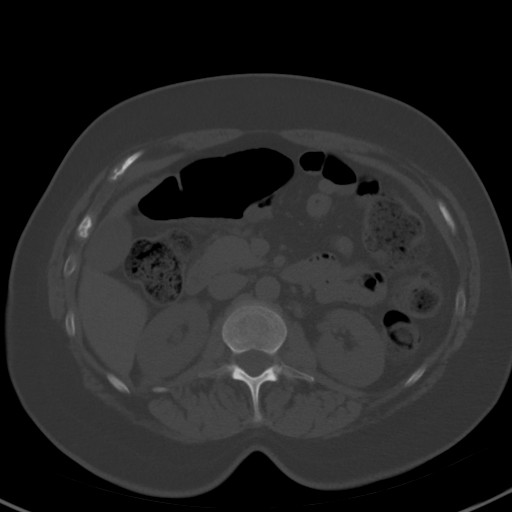
[im 71/92  soft-tissue]
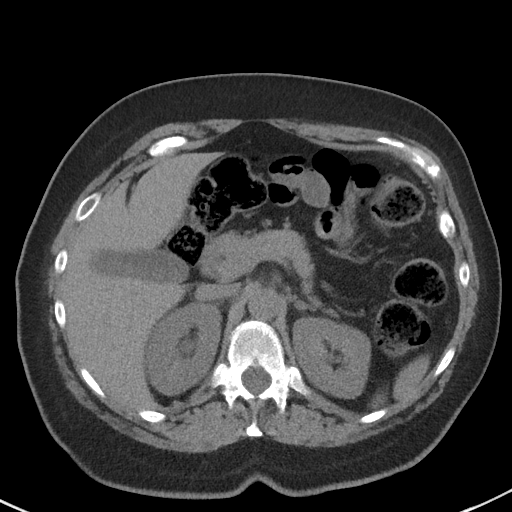
[im 78/92  soft-tissue]
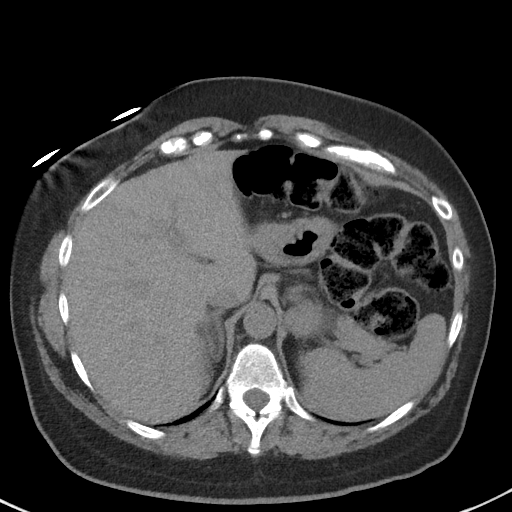
[im 85/92  soft-tissue]
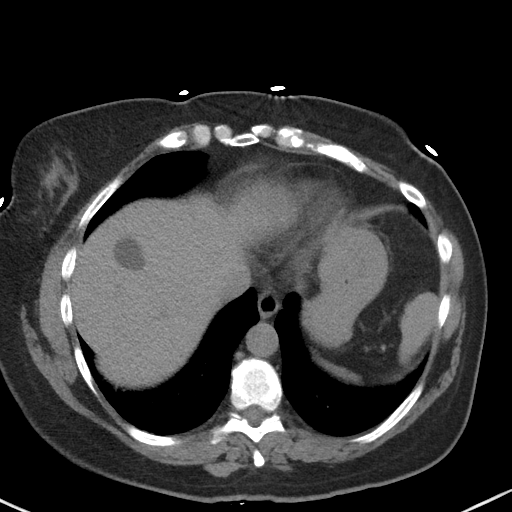

[Series 6: coronal · coronal · 0.58mm/px · 3 of 129 slices shown]
[im 43/129  soft-tissue]
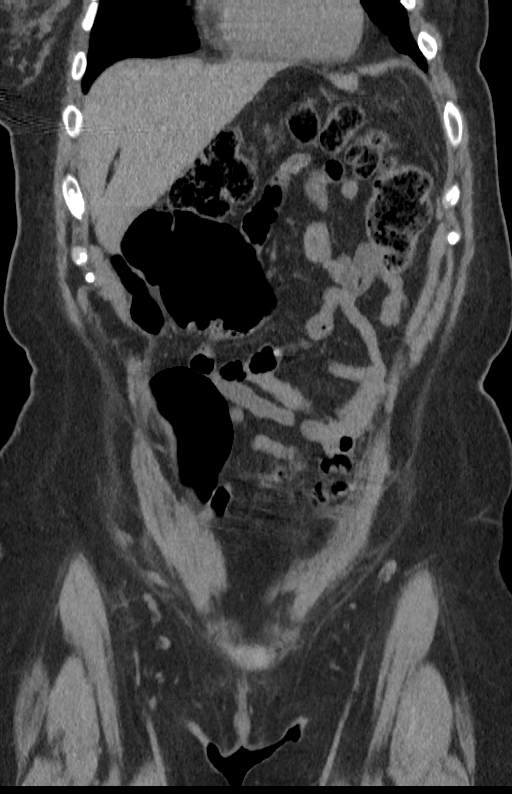
[im 57/129  soft-tissue]
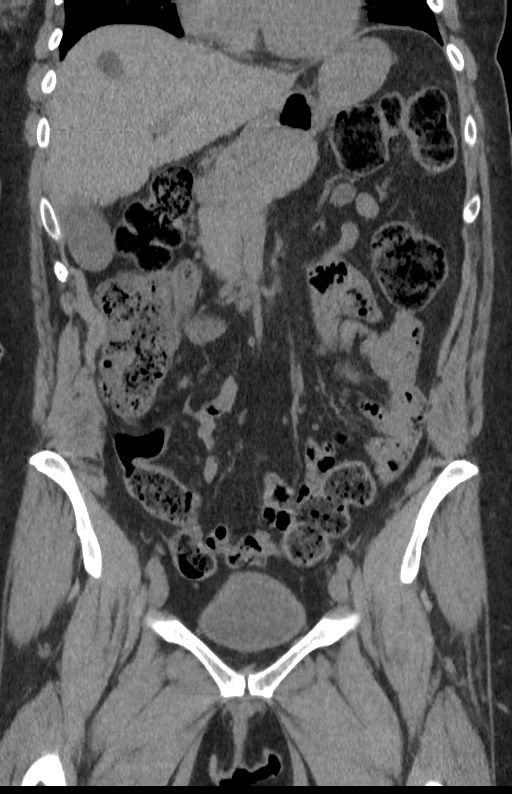
[im 72/129  soft-tissue]
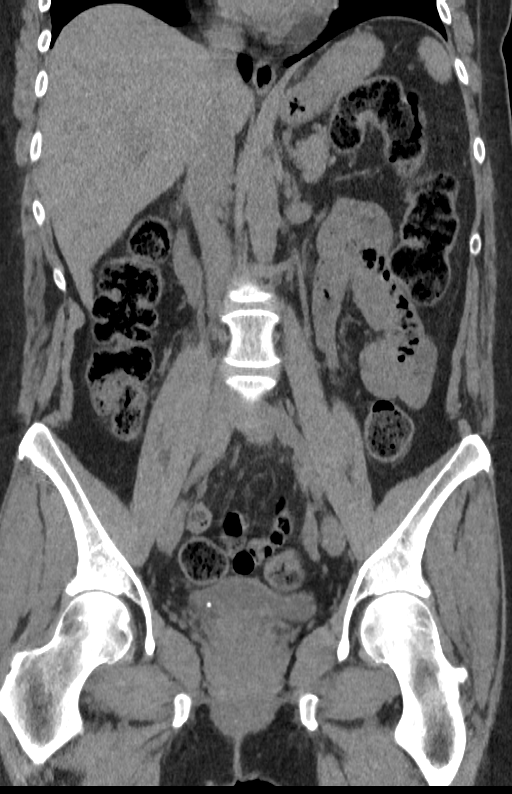

[15 of 46 positions shown; findings below may reference images not displayed]

FINDINGS: Lower chest: Normal.

Hepatobiliary: Multiple benign-appearing cysts in the right lobe of
the liver, the largest being 2.2 cm in the dome of the right lobe.
There is inhomogeneous density of the gallbladder suggestive of
gallstones. There is no gallbladder wall thickening or
pericholecystic fluid or dilatation of the bile ducts.

Pancreas: Unremarkable. No pancreatic ductal dilatation or
surrounding inflammatory changes.

Spleen: Normal in size without focal abnormality.

Adrenals/Urinary Tract: There is a 2.6 mm stone in the right
ureterovesical junction with slight dilatation of the right ureter.
There are no renal calculi. Adrenal glands are normal. Left kidney
appears normal. Bladder is normal.

Stomach/Bowel: Stomach is within normal limits. Appendix appears
normal. No evidence of bowel wall thickening, distention, or
inflammatory changes.

Vascular/Lymphatic: No significant vascular findings are present. No
enlarged abdominal or pelvic lymph nodes.

Reproductive: 19 mm cyst on the left ovary. Supracervical
hysterectomy. Normal right ovary.

Other: No abdominal wall hernia or abnormality. No abdominopelvic
ascites.

Musculoskeletal: No acute or significant osseous findings.
IMPRESSION: 1. 2.6 mm stone in the right ureterovesical junction causing slight
dilatation of the right ureter.
2. Multiple benign-appearing cysts in the liver.
3. Possible cholelithiasis.

## 2020-07-29 ENCOUNTER — Other Ambulatory Visit: Payer: Self-pay | Admitting: Internal Medicine

## 2020-07-29 DIAGNOSIS — Z1231 Encounter for screening mammogram for malignant neoplasm of breast: Secondary | ICD-10-CM

## 2020-07-29 DIAGNOSIS — R1032 Left lower quadrant pain: Secondary | ICD-10-CM

## 2020-08-16 ENCOUNTER — Ambulatory Visit: Payer: Managed Care, Other (non HMO)

## 2020-08-17 ENCOUNTER — Ambulatory Visit: Payer: Managed Care, Other (non HMO) | Admitting: Obstetrics and Gynecology

## 2020-08-17 NOTE — Progress Notes (Deleted)
PCP: Idelle Crouch, MD   No chief complaint on file.   HPI:      [name] is a 60 y.o. No obstetric history on file. who LMP was No LMP recorded. Patient has had a hysterectomy., presents today for her annual examination.  Her menses are absent due to menopause/lap supracx hyst due to leio and pelvic pain. She does not have intermenstrual bleeding.  She does not have vasomotor sx if she takes HRT. She uses estradiol 0.5 mg daily and prometrium 100 mg 6 nights on, 1 night off with sx control. Sx recur if she misses a dose.   Sex activity: single partner, contraception - post menopausal status. She does not have vaginal dryness.  Last Pap: 04/17/17  Results were: no abnormalities /neg HPV DNA.  Hx of STDs: none  Last mammogram: 03/24/18  Results were: normal. Repeat in 12 months.  There is no FH of breast cancer. There is no FH of ovarian cancer. The patient does not do self-breast exams.  Colonoscopy: colonoscopy 2014 without abnormalities.  Repeat due after 10 years.  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 Alcohol use: none  No drug use Exercise: not active  She does get adequate calcium and Vitamin D in her diet.  Labs with PCP.  Past Medical History:  Diagnosis Date  . Kidney stones   . Restless leg syndrome   . Thyroid disease     Past Surgical History:  Procedure Laterality Date  . ABDOMINAL HYSTERECTOMY    . COLONOSCOPY  2014   wnl repeat 10 yrs  . SUPRACERVICAL ABDOMINAL HYSTERECTOMY  2011   pelvic pain and leio    Family History  Problem Relation Age of Onset  . Hypothyroidism Mother   . Heart disease Father   . Hyperlipidemia Father   . Heart disease Sister   . Breast cancer Neg Hx     Social History   Socioeconomic History  . Marital status: Widowed    Spouse name: Not on file  . Number of children: Not on file  . Years of education: Not on file  .  Highest education level: Not on file  Occupational History  . Not on file  Tobacco Use  . Smoking status: Never Smoker  . Smokeless tobacco: Never Used  Vaping Use  . Vaping Use: Never used  Substance and Sexual Activity  . Alcohol use: No  . Drug use: No  . Sexual activity: Yes    Partners: Male    Birth control/protection: Surgical    Comment: Hysterectomy  Other Topics Concern  . Not on file  Social History Narrative  . Not on file   Social Determinants of Health   Financial Resource Strain: Not on file  Food Insecurity: Not on file  Transportation Needs: Not on file  Physical Activity: Not on file  Stress: Not on file  Social Connections: Not on file  Intimate Partner Violence: Not on file    No outpatient medications have been marked as taking for the 08/17/20 encounter (Appointment) with Derren Suydam, Deirdre Evener, PA-C.      ROS:  Review of Systems  Constitutional: Negative for fatigue, fever and unexpected weight change.  Respiratory: Negative for cough, shortness of breath and wheezing.   Cardiovascular: Negative for chest pain, palpitations and leg swelling.  Gastrointestinal: Negative for blood in stool, constipation, diarrhea, nausea and vomiting.  Endocrine: Negative  for cold intolerance, heat intolerance and polyuria.  Genitourinary: Negative for dyspareunia, dysuria, flank pain, frequency, genital sores, hematuria, menstrual problem, pelvic pain, urgency, vaginal bleeding, vaginal discharge and vaginal pain.  Musculoskeletal: Negative for back pain, joint swelling and myalgias.  Skin: Negative for rash.  Neurological: Negative for dizziness, syncope, light-headedness, numbness and headaches.  Hematological: Negative for adenopathy.  Psychiatric/Behavioral: Positive for agitation. Negative for confusion, sleep disturbance and suicidal ideas. The patient is not nervous/anxious.      Objective: There were no vitals taken for this visit.   Physical  Exam Constitutional:      Appearance: She is well-developed.  Genitourinary:     Vulva normal.     Genitourinary Comments: UTERUS SURG REM     No vaginal discharge, erythema or tenderness.      Right Adnexa: not tender and no mass present.    Left Adnexa: not tender and no mass present.    Uterus is absent.  Breasts:     Right: No mass, nipple discharge, skin change or tenderness.     Left: No mass, nipple discharge, skin change or tenderness.    Neck:     Thyroid: No thyromegaly.  Cardiovascular:     Rate and Rhythm: Normal rate and regular rhythm.     Heart sounds: Normal heart sounds. No murmur heard.   Pulmonary:     Effort: Pulmonary effort is normal.     Breath sounds: Normal breath sounds.  Abdominal:     Palpations: Abdomen is soft.     Tenderness: There is no abdominal tenderness. There is no guarding.  Musculoskeletal:        General: Normal range of motion.     Cervical back: Normal range of motion.  Neurological:     General: No focal deficit present.     Mental Status: She is alert and oriented to person, place, and time.     Cranial Nerves: No cranial nerve deficit.  Skin:    General: Skin is warm and dry.  Psychiatric:        Mood and Affect: Mood normal.        Behavior: Behavior normal.        Thought Content: Thought content normal.        Judgment: Judgment normal.  Vitals reviewed.     Assessment/Plan:  Encounter for annual routine gynecological examination  Encounter for screening mammogram for malignant neoplasm of breast--pt has mammo order placed by PCP. Plans to sched.  Hormone replacement therapy (HRT) - Plan: estradiol (ESTRACE) 0.5 MG tablet, progesterone (PROMETRIUM) 100 MG capsule, Rx RF to mail order and local pham.   Vasomotor symptoms due to menopause - Plan: estradiol (ESTRACE) 0.5 MG tablet, progesterone (PROMETRIUM) 100 MG capsule,    No orders of the defined types were placed in this encounter.           GYN counsel  breast self exam, mammography screening, menopause, adequate intake of calcium and vitamin D, diet and exercise    F/U  No follow-ups on file.  Lori Reaves B. Jakiah Bienaime, PA-C 08/17/2020 11:53 AM

## 2020-09-07 ENCOUNTER — Other Ambulatory Visit (HOSPITAL_COMMUNITY)
Admission: RE | Admit: 2020-09-07 | Discharge: 2020-09-07 | Disposition: A | Discharge status: Home / Self Care | Payer: Managed Care, Other (non HMO) | Source: Ambulatory Visit | Attending: Obstetrics and Gynecology | Admitting: Obstetrics and Gynecology

## 2020-09-07 ENCOUNTER — Encounter: Payer: Self-pay | Admitting: Obstetrics and Gynecology

## 2020-09-07 ENCOUNTER — Other Ambulatory Visit: Payer: Self-pay

## 2020-09-07 ENCOUNTER — Ambulatory Visit (INDEPENDENT_AMBULATORY_CARE_PROVIDER_SITE_OTHER): Payer: Managed Care, Other (non HMO) | Admitting: Obstetrics and Gynecology

## 2020-09-07 VITALS — BP 120/70 | Ht 64.0 in | Wt 174.0 lb

## 2020-09-07 DIAGNOSIS — Z79890 Hormone replacement therapy (postmenopausal): Secondary | ICD-10-CM

## 2020-09-07 DIAGNOSIS — Z124 Encounter for screening for malignant neoplasm of cervix: Secondary | ICD-10-CM | POA: Diagnosis not present

## 2020-09-07 DIAGNOSIS — N951 Menopausal and female climacteric states: Secondary | ICD-10-CM

## 2020-09-07 DIAGNOSIS — Z01419 Encounter for gynecological examination (general) (routine) without abnormal findings: Secondary | ICD-10-CM | POA: Diagnosis not present

## 2020-09-07 DIAGNOSIS — Z1151 Encounter for screening for human papillomavirus (HPV): Secondary | ICD-10-CM

## 2020-09-07 DIAGNOSIS — Z1231 Encounter for screening mammogram for malignant neoplasm of breast: Secondary | ICD-10-CM | POA: Diagnosis not present

## 2020-09-07 MED ORDER — PROGESTERONE MICRONIZED 100 MG PO CAPS: ORAL_CAPSULE | ORAL | 3 refills | Status: DC

## 2020-09-07 NOTE — Progress Notes (Signed)
PCP: Idelle Crouch, MD   Chief Complaint  Patient presents with  . Gynecologic Exam    No concerns    HPI:      [name] is a 60 y.o. No obstetric history on file. who LMP was No LMP recorded. Patient has had a hysterectomy., presents today for her annual examination.  Her menses are absent due to menopause/lap supracx hyst due to leio and pelvic pain. She does not have PMB.  She has mild vasomotor sx if she takes HRT. She uses estradiol 0.5 mg daily and prometrium 100 mg 6 nights on, 1 night off with sx control. Sx recur if she misses a dose. PCP Rx'd estradiol, just needs prog RF.   Sex activity: single partner, contraception - post menopausal status. She does have vaginal dryness, improved with lubricants. Also has decreased libido and decreased sensation vaginally.  Last Pap: 04/17/17  Results were: no abnormalities /neg HPV DNA.  Hx of STDs: none  Last mammogram: 03/24/18  Results were: normal. Repeat in 12 months.  There is no FH of breast cancer. There is no FH of ovarian cancer. The patient does not do self-breast exams.  Colonoscopy: colonoscopy 2014 without abnormalities.  Repeat due after 10 years.  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 Alcohol use: none  No drug use Exercise: not active  She does get adequate calcium and Vitamin D in her diet.  Labs with PCP.  Past Medical History:  Diagnosis Date  . Kidney stones   . Restless leg syndrome   . Thyroid disease     Past Surgical History:  Procedure Laterality Date  . ABDOMINAL HYSTERECTOMY    . COLONOSCOPY  2014   wnl repeat 10 yrs  . SUPRACERVICAL ABDOMINAL HYSTERECTOMY  2011   pelvic pain and leio    Family History  Problem Relation Age of Onset  . Hypothyroidism Mother   . Heart disease Father   . Hyperlipidemia Father   . Heart disease Sister   . Breast cancer Neg Hx     Social History    Socioeconomic History  . Marital status: Widowed    Spouse name: Not on file  . Number of children: Not on file  . Years of education: Not on file  . Highest education level: Not on file  Occupational History  . Not on file  Tobacco Use  . Smoking status: Never Smoker  . Smokeless tobacco: Never Used  Vaping Use  . Vaping Use: Never used  Substance and Sexual Activity  . Alcohol use: No  . Drug use: No  . Sexual activity: Yes    Partners: Male    Birth control/protection: Surgical    Comment: Hysterectomy  Other Topics Concern  . Not on file  Social History Narrative  . Not on file   Social Determinants of Health   Financial Resource Strain: Not on file  Food Insecurity: Not on file  Transportation Needs: Not on file  Physical Activity: Not on file  Stress: Not on file  Social Connections: Not on file  Intimate Partner Violence: Not on file    Current Meds  Medication Sig  . ALPRAZolam (XANAX) 0.25 MG tablet Take 0.25 mg by mouth 2 (two) times daily as needed.  . cetirizine (ZYRTEC) 10 MG chewable tablet Chew 10 mg by mouth daily.  . cholecalciferol (VITAMIN D) 1000 UNITS tablet Take 1,000 Units  by mouth daily.  Marland Kitchen desonide (DESOWEN) 0.05 % lotion Apply topically 2 (two) times daily.  Marland Kitchen doxycycline (ORACEA) 40 MG capsule Take 1 capsule (40 mg total) by mouth daily.  Marland Kitchen estradiol (ESTRACE) 0.5 MG tablet Take 1 tablet (0.5 mg total) by mouth daily.  Marland Kitchen ketoconazole (NIZORAL) 2 % cream Apply to affected areas one - two times daily as needed for rash.  . levothyroxine (SYNTHROID, LEVOTHROID) 25 MCG tablet Take by mouth.  . pantoprazole (PROTONIX) 20 MG tablet Take 20 mg by mouth daily.  Marland Kitchen rOPINIRole (REQUIP) 0.25 MG tablet Take by mouth.  . [DISCONTINUED] progesterone (PROMETRIUM) 100 MG capsule Take 1 cap nightly, 6 nights on, 1 night off      ROS:  Review of Systems  Constitutional: Negative for fatigue, fever and unexpected weight change.  Respiratory:  Negative for cough, shortness of breath and wheezing.   Cardiovascular: Negative for chest pain, palpitations and leg swelling.  Gastrointestinal: Negative for blood in stool, constipation, diarrhea, nausea and vomiting.  Endocrine: Negative for cold intolerance, heat intolerance and polyuria.  Genitourinary: Negative for dyspareunia, dysuria, flank pain, frequency, genital sores, hematuria, menstrual problem, pelvic pain, urgency, vaginal bleeding, vaginal discharge and vaginal pain.  Musculoskeletal: Negative for back pain, joint swelling and myalgias.  Skin: Negative for rash.  Neurological: Negative for dizziness, syncope, light-headedness, numbness and headaches.  Hematological: Negative for adenopathy.  Psychiatric/Behavioral: Negative for agitation, confusion, sleep disturbance and suicidal ideas. The patient is not nervous/anxious.      Objective: BP 120/70   Ht 5\' 4"  (1.626 m)   Wt 174 lb (78.9 kg)   BMI 29.87 kg/m    Physical Exam Constitutional:      Appearance: She is well-developed.  Genitourinary:     Vulva normal.     Right Labia: No rash, tenderness or lesions.    Left Labia: No tenderness, lesions or rash.    No vaginal discharge, erythema or tenderness.      Right Adnexa: not tender and no mass present.    Left Adnexa: not tender and no mass present.    No cervical friability or polyp.     Uterus is absent.  Breasts:     Right: No mass, nipple discharge, skin change or tenderness.     Left: No mass, nipple discharge, skin change or tenderness.    Neck:     Thyroid: No thyromegaly.  Cardiovascular:     Rate and Rhythm: Normal rate and regular rhythm.     Heart sounds: Normal heart sounds. No murmur heard.   Pulmonary:     Effort: Pulmonary effort is normal.     Breath sounds: Normal breath sounds.  Abdominal:     Palpations: Abdomen is soft.     Tenderness: There is no abdominal tenderness. There is no guarding or rebound.  Musculoskeletal:         General: Normal range of motion.     Cervical back: Normal range of motion.  Lymphadenopathy:     Cervical: No cervical adenopathy.  Neurological:     General: No focal deficit present.     Mental Status: She is alert and oriented to person, place, and time.     Cranial Nerves: No cranial nerve deficit.  Skin:    General: Skin is warm and dry.  Psychiatric:        Mood and Affect: Mood normal.        Behavior: Behavior normal.        Thought  Content: Thought content normal.        Judgment: Judgment normal.  Vitals reviewed.     Assessment/Plan:  Encounter for annual routine gynecological examination  Cervical cancer screening - Plan: Cytology - PAP  Screening for HPV (human papillomavirus) - Plan: Cytology - PAP  Encounter for screening mammogram for malignant neoplasm of breast - Plan: MM 3D SCREEN BREAST BILATERAL; pt to sched mammo  Vasomotor symptoms due to menopause - Plan: progesterone (PROMETRIUM) 100 MG capsule; doing well. Has estradiol Rx, Rx prog eRxd. Discussed how to wean off with ERT first, then prog. F/u prn.   Hormone replacement therapy (HRT) - Plan: progesterone (PROMETRIUM) 100 MG capsule    Meds ordered this encounter  Medications  . progesterone (PROMETRIUM) 100 MG capsule    Sig: Take 1 cap nightly, 6 nights on, 1 night off    Dispense:  90 capsule    Refill:  3    Order Specific Question:   Supervising Provider    Answer:   Gae Dry [[id number]]            GYN counsel breast self exam, mammography screening, menopause, adequate intake of calcium and vitamin D, diet and exercise    F/U  Return in about 1 year (around 09/07/2021).  Yarieliz Wasser B. Noha Karasik, PA-C 09/07/2020 4:41 PM

## 2020-09-07 NOTE — Patient Instructions (Addendum)
I value your feedback and you entrusting us with your care. If you get a [facility] patient survey, I would appreciate you taking the time to let us know about your experience today. Thank you!  [facility]: [phone]  [facility]: [phone]    

## 2020-09-09 LAB — CYTOLOGY - PAP
Adequacy: ABSENT
Comment: NEGATIVE
Diagnosis: NEGATIVE
High risk HPV: NEGATIVE

## 2020-11-01 ENCOUNTER — Other Ambulatory Visit: Payer: Self-pay

## 2020-11-01 ENCOUNTER — Ambulatory Visit: Payer: Managed Care, Other (non HMO) | Admitting: Dermatology

## 2020-11-01 DIAGNOSIS — D229 Melanocytic nevi, unspecified: Secondary | ICD-10-CM

## 2020-11-01 DIAGNOSIS — S20469A Insect bite (nonvenomous) of unspecified back wall of thorax, initial encounter: Secondary | ICD-10-CM

## 2020-11-01 DIAGNOSIS — Z1283 Encounter for screening for malignant neoplasm of skin: Secondary | ICD-10-CM

## 2020-11-01 DIAGNOSIS — Q825 Congenital non-neoplastic nevus: Secondary | ICD-10-CM | POA: Diagnosis not present

## 2020-11-01 DIAGNOSIS — L570 Actinic keratosis: Secondary | ICD-10-CM

## 2020-11-01 DIAGNOSIS — L578 Other skin changes due to chronic exposure to nonionizing radiation: Secondary | ICD-10-CM

## 2020-11-01 DIAGNOSIS — L719 Rosacea, unspecified: Secondary | ICD-10-CM

## 2020-11-01 DIAGNOSIS — W57XXXA Bitten or stung by nonvenomous insect and other nonvenomous arthropods, initial encounter: Secondary | ICD-10-CM

## 2020-11-01 DIAGNOSIS — L814 Other melanin hyperpigmentation: Secondary | ICD-10-CM

## 2020-11-01 DIAGNOSIS — D1800 Hemangioma unspecified site: Secondary | ICD-10-CM

## 2020-11-01 DIAGNOSIS — L821 Other seborrheic keratosis: Secondary | ICD-10-CM

## 2020-11-01 MED ORDER — RHOFADE 1 % EX CREA: TOPICAL_CREAM | CUTANEOUS | 11 refills | Status: DC

## 2020-11-01 MED ORDER — DOXYCYCLINE 40 MG PO CPDR: 40.0000 mg | DELAYED_RELEASE_CAPSULE | Freq: Every day | ORAL | 11 refills | Status: DC

## 2020-11-01 NOTE — Patient Instructions (Signed)
Actinic keratoses are precancerous spots that appear secondary to cumulative UV radiation exposure/sun exposure over time. They are chronic with expected duration over 1 year. A portion of actinic keratoses will progress to squamous cell carcinoma of the skin. It is not possible to reliably predict which spots will progress to skin cancer and so treatment is recommended to prevent development of skin cancer.  Recommend daily broad spectrum sunscreen SPF 30+ to sun-exposed areas, reapply every 2 hours as needed.  Recommend staying in the shade or wearing long sleeves, sun glasses (UVA+UVB protection) and wide brim hats (4-inch brim around the entire circumference of the hat). Call for new or changing lesions.   Cryotherapy Aftercare  Wash gently with soap and water everyday.   Apply Vaseline and Band-Aid daily until healed.    Melanoma ABCDEs  Melanoma is the most dangerous type of skin cancer, and is the leading cause of death from skin disease.  You are more likely to develop melanoma if you: Have light-colored skin, light-colored eyes, or red or blond hair Spend a lot of time in the sun Tan regularly, either outdoors or in a tanning bed Have had blistering sunburns, especially during childhood Have a close family member who has had a melanoma Have atypical moles or large birthmarks  Early detection of melanoma is key since treatment is typically straightforward and cure rates are extremely high if we catch it early.   The first sign of melanoma is often a change in a mole or a new dark spot.  The ABCDE system is a way of remembering the signs of melanoma.  A for asymmetry:  The two halves do not match. B for border:  The edges of the growth are irregular. C for color:  A mixture of colors are present instead of an even brown color. D for diameter:  Melanomas are usually (but not always) greater than 22mm - the size of a pencil eraser. E for evolution:  The spot keeps changing in size,  shape, and color.  Please check your skin once per month between visits. You can use a small mirror in front and a large mirror behind you to keep an eye on the back side or your body.   If you see any new or changing lesions before your next follow-up, please call to schedule a visit.  Please continue daily skin protection including broad spectrum sunscreen SPF 30+ to sun-exposed areas, reapplying every 2 hours as needed when you're outdoors.   Staying in the shade or wearing long sleeves, sun glasses (UVA+UVB protection) and wide brim hats (4-inch brim around the entire circumference of the hat) are also recommended for sun protection.    Rosacea is a chronic progressive skin condition usually affecting the face of adults, causing redness and/or acne bumps. It is treatable but not curable. It sometimes affects the eyes (ocular rosacea) as well. It may respond to topical and/or systemic medication and can flare with stress, sun exposure, alcohol, exercise and some foods.  Daily application of broad spectrum spf 30+ sunscreen to face is recommended to reduce flares.  Doxycycline should be taken with food to prevent nausea. Do not lay down for 30 minutes after taking. Be cautious with sun exposure and use good sun protection while on this medication. Pregnant women should not take this medication.   If you have any questions or concerns for your doctor, please call our main line at [phone] and press option 4 to reach your doctor's medical assistant.  If no one answers, please leave a voicemail as directed and we will return your call as soon as possible. Messages left after 4 pm will be answered the following business day.   You may also send [place] a message via [place]. We typically respond to MyChart messages within 1-2 business days.  For prescription refills, please ask your pharmacy to contact our office. Our fax number is [phone].  If you have an urgent issue when the clinic is closed  that cannot wait until the next business day, you can page your doctor at the number below.    Please note that while we do our best to be available for urgent issues outside of office hours, we are not available 24/7.   If you have an urgent issue and are unable to reach [place], you may choose to seek medical care at your doctor's office, retail clinic, urgent care center, or emergency room.  If you have a medical emergency, please immediately call 911 or go to the emergency department.  Pager Numbers  - Dr. Nehemiah Massed: [phone]  - Dr. Laurence Ferrari: [phone]  - Dr. Nicole Kindred: [phone]  In the event of inclement weather, please call our main line at [phone] for an update on the status of any delays or closures.  Dermatology Medication Tips: Please keep the boxes that topical medications come in in order to help keep track of the instructions about where and how to use these. Pharmacies typically print the medication instructions only on the boxes and not directly on the medication tubes.   If your medication is too expensive, please contact our office at [phone] option 4 or send [place] a message through [place].   We are unable to tell what your co-pay for medications will be in advance as this is different depending on your insurance coverage. However, we may be able to find a substitute medication at lower cost or fill out paperwork to get insurance to cover a needed medication.   If a prior authorization is required to get your medication covered by your insurance company, please allow [place] 1-2 business days to complete this process.  Drug prices often vary depending on where the prescription is filled and some pharmacies may offer cheaper prices.  The website www.goodrx.com contains coupons for medications through different pharmacies. The prices here do not account for what the cost may be with help from insurance (it may be cheaper with your insurance), but the website can give you  the price if you did not use any insurance.  - You can print the associated coupon and take it with your prescription to the pharmacy.  - You may also stop by our office during regular business hours and pick up a GoodRx coupon card.  - If you need your prescription sent electronically to a different pharmacy, notify our office through [facility] or by phone at [phone] option 4.

## 2020-11-01 NOTE — Progress Notes (Signed)
Follow-Up Visit   Subjective  Lori Morris is a 60 y.o. female who presents for the following: Annual Exam (Patient here today for yearly tbse. She reports she has one spot at right side of cheek under  eye and 1 spot at left jaw area she would like checked. She would also like to talk about rosacea on face. Patient states she has been taking doxycycline 40 mg which helps.  She is interested in restarting cream which decreases redness ).  Patient here for full body skin exam and skin cancer screening.  The following portions of the chart were reviewed this encounter and updated as appropriate:       Objective  Well appearing patient in no apparent distress; mood and affect are within normal limits.  A full examination was performed including scalp, head, eyes, ears, nose, lips, neck, chest, axillae, abdomen, back, buttocks, bilateral upper extremities, bilateral lower extremities, hands, feet, fingers, toes, fingernails, and toenails. All findings within normal limits unless otherwise noted below.  Right Zygomatic x 1, left mid jaw x 1 (2) Erythematous thin papules/macules with gritty scale.   nose and b/l malar cheeks Erythema on nose and malar cheeks   Right Thigh - Anterior Violaceous patch  back red swollen papule   Assessment & Plan  Actinic keratosis (2) Right Zygomatic x 1, left mid jaw x 1  Prior to procedure, discussed risks of blister formation, small wound, skin dyspigmentation, or rare scar following cryotherapy. Recommend Vaseline ointment to treated areas while healing.   Destruction of lesion - Right Zygomatic x 1, left mid jaw x 1  Destruction method: cryotherapy   Informed consent: discussed and consent obtained   Lesion destroyed using liquid nitrogen: Yes   Region frozen until ice ball extended beyond lesion: Yes   Outcome: patient tolerated procedure well with no complications   Post-procedure details: wound care instructions given     Rosacea nose and b/l malar cheeks  Chronic condition with duration or expected duration over one year. Condition is bothersome to patient. Currently flared.  Rosacea is a chronic progressive skin condition usually affecting the face of adults, causing redness and/or acne bumps. It is treatable but not curable. It sometimes affects the eyes (ocular rosacea) as well. It may respond to topical and/or systemic medication and can flare with stress, sun exposure, alcohol, exercise and some foods.  Daily application of broad spectrum spf 30+ sunscreen to face is recommended to reduce flares.   Continue doxycycline ER 40 mg capsule by mouth daily as needed  Rhofade 1 % cream qam 2 samples given in office today   Rx[facility]   Doxycycline should be taken with food to prevent nausea. Do not lay down for 30 minutes after taking. Be cautious with sun exposure and use good sun protection while on this medication. Pregnant women should not take this medication.    Oxymetazoline HCl (RHOFADE) 1 % CREA - nose and b/l malar cheeks Apply to affected areas every morning  doxycycline (ORACEA) 40 MG capsule - nose and b/l malar cheeks Take 1 capsule (40 mg total) by mouth daily. Take with food  Vascular birthmark Right Thigh - Anterior  Benign, observe.     Bug bite without infection, initial encounter back  Benign, observe    Lentigines - Scattered tan macules - Due to sun exposure - Benign-appering, observe - Recommend daily broad spectrum sunscreen SPF 30+ to sun-exposed areas, reapply every 2 hours as needed. -  Call for any changes  Seborrheic Keratoses - Stuck-on, waxy, tan-brown papules and/or plaques  - Benign-appearing - Discussed benign etiology and prognosis. - Observe - Call for any changes  Melanocytic Nevi - Tan-brown and/or pink-flesh-colored symmetric macules and papules - Benign appearing on exam today - Observation - Call clinic for new or  changing moles - Recommend daily use of broad spectrum spf 30+ sunscreen to sun-exposed areas.   Hemangiomas - Red papules - Discussed benign nature - Observe - Call for any changes  Actinic Damage - Chronic condition, secondary to cumulative UV/sun exposure - diffuse scaly erythematous macules with underlying dyspigmentation - Recommend daily broad spectrum sunscreen SPF 30+ to sun-exposed areas, reapply every 2 hours as needed.  - Staying in the shade or wearing long sleeves, sun glasses (UVA+UVB protection) and wide brim hats (4-inch brim around the entire circumference of the hat) are also recommended for sun protection.  - Call for new or changing lesions.  Skin cancer screening performed today.  Return in about 1 year (around 11/01/2021) for tbse .  I, Ruthell Rummage, CMA, am acting as scribe for Brendolyn Patty, MD.  Documentation: I have reviewed the above documentation for accuracy and completeness, and I agree with the above.  Brendolyn Patty MD

## 2021-01-20 ENCOUNTER — Other Ambulatory Visit: Payer: Self-pay | Admitting: Physician Assistant

## 2021-01-20 DIAGNOSIS — S32010A Wedge compression fracture of first lumbar vertebra, initial encounter for closed fracture: Secondary | ICD-10-CM

## 2021-01-22 ENCOUNTER — Other Ambulatory Visit: Payer: Self-pay

## 2021-01-22 ENCOUNTER — Ambulatory Visit
Admission: RE | Admit: 2021-01-22 | Discharge: 2021-01-22 | Disposition: A | Discharge status: Home / Self Care | Payer: Managed Care, Other (non HMO) | Source: Ambulatory Visit | Attending: Physician Assistant | Admitting: Physician Assistant

## 2021-01-22 DIAGNOSIS — S32010A Wedge compression fracture of first lumbar vertebra, initial encounter for closed fracture: Secondary | ICD-10-CM | POA: Diagnosis present

## 2021-02-06 ENCOUNTER — Other Ambulatory Visit: Payer: Self-pay | Admitting: Orthopedic Surgery

## 2021-02-08 ENCOUNTER — Other Ambulatory Visit: Payer: Self-pay

## 2021-02-08 ENCOUNTER — Encounter
Admission: RE | Admit: 2021-02-08 | Discharge: 2021-02-08 | Disposition: A | Discharge status: Home / Self Care | Payer: Managed Care, Other (non HMO) | Source: Ambulatory Visit | Attending: Orthopedic Surgery | Admitting: Orthopedic Surgery

## 2021-02-08 HISTORY — DX: Anxiety disorder, unspecified: F41.9

## 2021-02-08 HISTORY — DX: Sleep apnea, unspecified: G47.30

## 2021-02-08 HISTORY — DX: Personal history of urinary calculi: Z87.442

## 2021-02-08 HISTORY — DX: Other intervertebral disc degeneration, lumbar region: M51.36

## 2021-02-08 HISTORY — DX: Other intervertebral disc degeneration, lumbar region without mention of lumbar back pain or lower extremity pain: M51.369

## 2021-02-08 HISTORY — DX: Hypothyroidism, unspecified: E03.9

## 2021-02-08 HISTORY — DX: Other specified disorders of bone density and structure, unspecified site: M85.80

## 2021-02-08 HISTORY — DX: Gastro-esophageal reflux disease without esophagitis: K21.9

## 2021-02-08 NOTE — Patient Instructions (Addendum)
Your procedure is scheduled on: Thursday, September 22 Report to the Registration Desk on the 1st floor of the [facility]. To find out your arrival time, please call [phone] between 1PM - 3PM on: Wednesday, September 21  REMEMBER: Instructions that are not followed completely may result in serious medical risk, up to and including death; or upon the discretion of your surgeon and anesthesiologist your surgery may need to be rescheduled.  Do not eat food after midnight the night before surgery.  No gum chewing, lozengers or hard candies.  You may however, drink CLEAR liquids up to 2 hours before you are scheduled to arrive for your surgery. Do not drink anything within 2 hours of your scheduled arrival time.  Clear liquids include: - water  - apple juice without pulp - gatorade (not RED, PURPLE, OR BLUE) - black coffee or tea (Do NOT add milk or creamers to the coffee or tea) Do NOT drink anything that is not on this list.  In addition, your doctor has ordered for you to drink the provided  Ensure Pre-Surgery Clear Carbohydrate Drink  Drinking this carbohydrate drink up to two hours before surgery helps to reduce insulin resistance and improve patient outcomes. Please complete drinking 2 hours prior to scheduled arrival time.  TAKE THESE MEDICATIONS THE MORNING OF SURGERY WITH A SIP OF WATER:  Levothyroxine Pantoprazole (Protonix) - (take one the night before and one on the morning of surgery - helps to prevent nausea after surgery.) Venlafaxine (Effexor)  One week prior to surgery: Stop Anti-inflammatories (NSAIDS) such as Advil, Aleve, Ibuprofen, Motrin, Naproxen, Naprosyn and Aspirin based products such as Excedrin, Goodys Powder, BC Powder. Stop ANY OVER THE COUNTER supplements until after surgery. You may however, continue to take Tylenol if needed for pain up until the day of surgery.  No Alcohol for 24 hours before or after surgery.  No Smoking including  e-cigarettes for 24 hours prior to surgery.  No chewable tobacco products for at least 6 hours prior to surgery.  No nicotine patches on the day of surgery.  Do not use any "recreational" drugs for at least a week prior to your surgery.  Please be advised that the combination of cocaine and anesthesia may have negative outcomes, up to and including death. If you test positive for cocaine, your surgery will be cancelled.  On the morning of surgery brush your teeth with toothpaste and water, you may rinse your mouth with mouthwash if you wish. Do not swallow any toothpaste or mouthwash.  Use CHG Soap as directed on instruction sheet.  Do not wear jewelry, make-up, hairpins, clips or nail polish.  Do not wear lotions, powders, or perfumes.   Do not shave body from the neck down 48 hours prior to surgery just in case you cut yourself which could leave a site for infection.  Also, freshly shaved skin may become irritated if using the CHG soap.  Contact lenses, hearing aids and dentures may not be worn into surgery.  Do not bring valuables to the hospital. [facility] is not responsible for any missing/lost belongings or valuables.   Bring your C-PAP to the hospital with you in case you may have to spend the night.   Notify your doctor if there is any change in your medical condition (cold, fever, infection).  Wear comfortable clothing (specific to your surgery type) to the hospital.  After surgery, you can help prevent lung complications by doing breathing exercises.  Take deep breaths and  cough every 1-2 hours. Your doctor may order a device called an Incentive Spirometer to help you take deep breaths.  If you are being discharged the day of surgery, you will not be allowed to drive home. You will need a responsible adult (18 years or older) to drive you home and stay with you that night.   If you are taking public transportation, you will need to have a responsible adult (18 years or  older) with you. Please confirm with your physician that it is acceptable to use public transportation.   Please call the [place] [facility] at [phone] if you have any questions about these instructions.  Surgery Visitation Policy:  Patients undergoing a surgery or procedure may have one family member or support person with them as long as that person is not COVID-19 positive or experiencing its symptoms.  That person may remain in the waiting area during the procedure and may rotate out with other people.

## 2021-02-09 ENCOUNTER — Ambulatory Visit
Admission: RE | Admit: 2021-02-09 | Discharge: 2021-02-09 | Disposition: A | Discharge status: Home / Self Care | Payer: Managed Care, Other (non HMO) | Source: Ambulatory Visit | Attending: Orthopedic Surgery | Admitting: Orthopedic Surgery

## 2021-02-09 ENCOUNTER — Ambulatory Visit: Payer: Managed Care, Other (non HMO) | Admitting: Certified Registered"

## 2021-02-09 ENCOUNTER — Other Ambulatory Visit: Payer: Self-pay

## 2021-02-09 ENCOUNTER — Encounter
Admission: RE | Disposition: A | Discharge status: Home / Self Care | Payer: Self-pay | Source: Ambulatory Visit | Attending: Orthopedic Surgery

## 2021-02-09 ENCOUNTER — Ambulatory Visit: Payer: Managed Care, Other (non HMO)

## 2021-02-09 ENCOUNTER — Encounter: Payer: Self-pay | Admitting: Orthopedic Surgery

## 2021-02-09 DIAGNOSIS — W228XXA Striking against or struck by other objects, initial encounter: Secondary | ICD-10-CM | POA: Insufficient documentation

## 2021-02-09 DIAGNOSIS — M810 Age-related osteoporosis without current pathological fracture: Secondary | ICD-10-CM | POA: Diagnosis not present

## 2021-02-09 DIAGNOSIS — S32010A Wedge compression fracture of first lumbar vertebra, initial encounter for closed fracture: Secondary | ICD-10-CM | POA: Diagnosis not present

## 2021-02-09 DIAGNOSIS — Z8616 Personal history of COVID-19: Secondary | ICD-10-CM | POA: Diagnosis not present

## 2021-02-09 DIAGNOSIS — Z79899 Other long term (current) drug therapy: Secondary | ICD-10-CM | POA: Insufficient documentation

## 2021-02-09 DIAGNOSIS — Z881 Allergy status to other antibiotic agents status: Secondary | ICD-10-CM | POA: Diagnosis not present

## 2021-02-09 DIAGNOSIS — S32019A Unspecified fracture of first lumbar vertebra, initial encounter for closed fracture: Secondary | ICD-10-CM | POA: Diagnosis present

## 2021-02-09 DIAGNOSIS — M47816 Spondylosis without myelopathy or radiculopathy, lumbar region: Secondary | ICD-10-CM | POA: Diagnosis not present

## 2021-02-09 DIAGNOSIS — Z419 Encounter for procedure for purposes other than remedying health state, unspecified: Secondary | ICD-10-CM

## 2021-02-09 DIAGNOSIS — Z79890 Hormone replacement therapy (postmenopausal): Secondary | ICD-10-CM | POA: Insufficient documentation

## 2021-02-09 HISTORY — PX: KYPHOPLASTY: SHX5884

## 2021-02-09 SURGERY — KYPHOPLASTY
Anesthesia: General

## 2021-02-09 MED ORDER — ONDANSETRON HCL 4 MG PO TABS: 4.0000 mg | ORAL_TABLET | Freq: Four times a day (QID) | ORAL | Status: DC | PRN

## 2021-02-09 MED ORDER — ONDANSETRON HCL 4 MG/2ML IJ SOLN: 4.0000 mg | Freq: Four times a day (QID) | INTRAMUSCULAR | Status: DC | PRN

## 2021-02-09 MED ORDER — FENTANYL CITRATE (PF) 100 MCG/2ML IJ SOLN
25.0000 ug | INTRAMUSCULAR | Status: DC | PRN
  Administered 2021-02-09 (×2): 25 ug via INTRAVENOUS

## 2021-02-09 MED ORDER — CHLORHEXIDINE GLUCONATE 0.12 % MT SOLN: 15.0000 mL | Freq: Once | OROMUCOSAL | Status: AC

## 2021-02-09 MED ORDER — SODIUM CHLORIDE (PF) 0.9 % IJ SOLN
INTRAMUSCULAR | Status: DC | PRN
  Administered 2021-02-09: 20 mL

## 2021-02-09 MED ORDER — HYDROCODONE-ACETAMINOPHEN 7.5-325 MG PO TABS: 1.0000 | ORAL_TABLET | ORAL | Status: DC | PRN

## 2021-02-09 MED ORDER — LACTATED RINGERS IV SOLN: INTRAVENOUS | Status: DC

## 2021-02-09 MED ORDER — LIDOCAINE HCL (CARDIAC) PF 100 MG/5ML IV SOSY
PREFILLED_SYRINGE | INTRAVENOUS | Status: DC | PRN
  Administered 2021-02-09: 100 mg via INTRATRACHEAL

## 2021-02-09 MED ORDER — ONDANSETRON HCL 4 MG/2ML IJ SOLN
INTRAMUSCULAR | Status: DC | PRN
  Administered 2021-02-09: 4 mg via INTRAVENOUS

## 2021-02-09 MED ORDER — CHLORHEXIDINE GLUCONATE 0.12 % MT SOLN
OROMUCOSAL | Status: AC
  Administered 2021-02-09: 15 mL via OROMUCOSAL
  Filled 2021-02-09: qty 15

## 2021-02-09 MED ORDER — CEFAZOLIN SODIUM-DEXTROSE 2-4 GM/100ML-% IV SOLN
INTRAVENOUS | Status: AC
  Filled 2021-02-09: qty 100

## 2021-02-09 MED ORDER — LIDOCAINE HCL (PF) 1 % IJ SOLN
INTRAMUSCULAR | Status: AC
  Filled 2021-02-09: qty 30

## 2021-02-09 MED ORDER — MIDAZOLAM HCL 2 MG/2ML IJ SOLN
INTRAMUSCULAR | Status: AC
  Filled 2021-02-09: qty 2

## 2021-02-09 MED ORDER — FENTANYL CITRATE (PF) 100 MCG/2ML IJ SOLN
INTRAMUSCULAR | Status: AC
  Filled 2021-02-09: qty 2

## 2021-02-09 MED ORDER — PROPOFOL 500 MG/50ML IV EMUL
INTRAVENOUS | Status: DC | PRN
  Administered 2021-02-09: 50 ug/kg/min via INTRAVENOUS

## 2021-02-09 MED ORDER — METOCLOPRAMIDE HCL 5 MG/ML IJ SOLN: 5.0000 mg | Freq: Three times a day (TID) | INTRAMUSCULAR | Status: DC | PRN

## 2021-02-09 MED ORDER — CEFAZOLIN SODIUM-DEXTROSE 2-4 GM/100ML-% IV SOLN
2.0000 g | INTRAVENOUS | Status: AC
  Administered 2021-02-09: 2 g via INTRAVENOUS

## 2021-02-09 MED ORDER — MORPHINE SULFATE (PF) 2 MG/ML IV SOLN: 0.5000 mg | INTRAVENOUS | Status: DC | PRN

## 2021-02-09 MED ORDER — PROPOFOL 10 MG/ML IV BOLUS
INTRAVENOUS | Status: AC
  Filled 2021-02-09: qty 40

## 2021-02-09 MED ORDER — HYDROCODONE-ACETAMINOPHEN 5-325 MG PO TABS
ORAL_TABLET | ORAL | Status: AC
  Administered 2021-02-09: 1 via ORAL
  Filled 2021-02-09: qty 1

## 2021-02-09 MED ORDER — METOCLOPRAMIDE HCL 10 MG PO TABS: 5.0000 mg | ORAL_TABLET | Freq: Three times a day (TID) | ORAL | Status: DC | PRN

## 2021-02-09 MED ORDER — ACETAMINOPHEN 325 MG PO TABS
325.0000 mg | ORAL_TABLET | Freq: Four times a day (QID) | ORAL | Status: DC | PRN
Start: 2021-02-10 — End: 2021-02-09

## 2021-02-09 MED ORDER — HYDROCODONE-ACETAMINOPHEN 5-325 MG PO TABS: 1.0000 | ORAL_TABLET | ORAL | Status: DC | PRN

## 2021-02-09 MED ORDER — HYDROCODONE-ACETAMINOPHEN 5-325 MG PO TABS: 1.0000 | ORAL_TABLET | Freq: Four times a day (QID) | ORAL | 0 refills | Status: DC | PRN

## 2021-02-09 MED ORDER — BUPIVACAINE-EPINEPHRINE (PF) 0.5% -1:200000 IJ SOLN
INTRAMUSCULAR | Status: DC | PRN
  Administered 2021-02-09: 20 mL via PERINEURAL

## 2021-02-09 MED ORDER — IOHEXOL 180 MG/ML  SOLN
INTRAMUSCULAR | Status: DC | PRN
  Administered 2021-02-09: 10 mL

## 2021-02-09 MED ORDER — SODIUM CHLORIDE 0.9 % IV SOLN: INTRAVENOUS | Status: DC

## 2021-02-09 MED ORDER — MIDAZOLAM HCL 2 MG/2ML IJ SOLN
INTRAMUSCULAR | Status: DC | PRN
  Administered 2021-02-09: 2 mg via INTRAVENOUS

## 2021-02-09 MED ORDER — LIDOCAINE HCL 1 % IJ SOLN
INTRAMUSCULAR | Status: DC | PRN
  Administered 2021-02-09: 30 mL

## 2021-02-09 MED ORDER — PROPOFOL 10 MG/ML IV BOLUS
INTRAVENOUS | Status: DC | PRN
  Administered 2021-02-09 (×2): 30 mg via INTRAVENOUS
  Administered 2021-02-09: 40 mg via INTRAVENOUS

## 2021-02-09 MED ORDER — FENTANYL CITRATE (PF) 100 MCG/2ML IJ SOLN
INTRAMUSCULAR | Status: AC
  Administered 2021-02-09: 50 ug via INTRAVENOUS
  Filled 2021-02-09: qty 2

## 2021-02-09 MED ORDER — BUPIVACAINE-EPINEPHRINE (PF) 0.5% -1:200000 IJ SOLN
INTRAMUSCULAR | Status: AC
  Filled 2021-02-09: qty 30

## 2021-02-09 MED ORDER — FENTANYL CITRATE (PF) 100 MCG/2ML IJ SOLN
INTRAMUSCULAR | Status: DC | PRN
  Administered 2021-02-09: 25 ug via INTRAVENOUS
  Administered 2021-02-09: 50 ug via INTRAVENOUS
  Administered 2021-02-09: 25 ug via INTRAVENOUS

## 2021-02-09 MED ORDER — ORAL CARE MOUTH RINSE: 15.0000 mL | Freq: Once | OROMUCOSAL | Status: AC

## 2021-02-09 SURGICAL SUPPLY — 22 items
CEMENT KYPHON CX01A KIT/MIXER (Cement) ×2 IMPLANT
DERMABOND ADVANCED (GAUZE/BANDAGES/DRESSINGS) ×1
DERMABOND ADVANCED .7 DNX12 (GAUZE/BANDAGES/DRESSINGS) ×1 IMPLANT
DEVICE BIOPSY BONE KYPHX (INSTRUMENTS) ×3 IMPLANT
DRAPE C-ARM XRAY 36X54 (DRAPES) ×2 IMPLANT
DRSG TELFA 4X3 1S NADH ST (GAUZE/BANDAGES/DRESSINGS) ×1 IMPLANT
DURAPREP 26ML APPLICATOR (WOUND CARE) ×1 IMPLANT
FEE RENTAL RFA GENERATOR (MISCELLANEOUS) IMPLANT
GAUZE 4X4 16PLY ~~LOC~~+RFID DBL (SPONGE) ×2 IMPLANT
GLOVE SURG SYN 9.0  PF PI (GLOVE) ×1
GLOVE SURG SYN 9.0 PF PI (GLOVE) ×1 IMPLANT
GOWN SRG 2XL LVL 4 RGLN SLV (GOWNS) ×1 IMPLANT
GOWN STRL NON-REIN 2XL LVL4 (GOWNS) ×1
GOWN STRL REUS W/ TWL LRG LVL3 (GOWN DISPOSABLE) ×1 IMPLANT
GOWN STRL REUS W/TWL LRG LVL3 (GOWN DISPOSABLE) ×1
MANIFOLD NEPTUNE II (INSTRUMENTS) ×2 IMPLANT
PACK KYPHOPLASTY (MISCELLANEOUS) ×2 IMPLANT
RENTAL RFA GENERATOR (MISCELLANEOUS) IMPLANT
STRAP SAFETY 5IN WIDE (MISCELLANEOUS) ×2 IMPLANT
SWABSTK COMLB BENZOIN TINCTURE (MISCELLANEOUS) ×2 IMPLANT
TRAY KYPHOPAK 15/3 EXPRESS 1ST (MISCELLANEOUS) ×1 IMPLANT
WATER STERILE IRR 500ML POUR (IV SOLUTION) ×2 IMPLANT

## 2021-02-09 NOTE — Discharge Instructions (Addendum)
Take it easy today and try to his resume more normal activities tomorrow except avoid lifting over 10 pounds for 2 weeks. Remove Band-Aids on Saturday then okay to shower Pain medicine as directed Call office if you are having problems   AMBULATORY SURGERY  DISCHARGE INSTRUCTIONS   The drugs that you were given will stay in your system until tomorrow so for the next 24 hours you should not:  Drive an automobile Make any legal decisions Drink any alcoholic beverage   You may resume regular meals tomorrow.  Today it is better to start with liquids and gradually work up to solid foods.  You may eat anything you prefer, but it is better to start with liquids, then soup and crackers, and gradually work up to solid foods.   Please notify your doctor immediately if you have any unusual bleeding, trouble breathing, redness and pain at the surgery site, drainage, fever, or pain not relieved by medication.    Additional Instructions:        Please contact your physician with any problems or Same Day Surgery at [phone], Monday through Friday 6 am to 4 pm, or [place] at [facility] number at [phone].

## 2021-02-09 NOTE — Transfer of Care (Signed)
Immediate Anesthesia Transfer of Care Note  Patient: Lori Morris  Procedure(s) Performed: L1 KYPHOPLASTY  Patient Location: PACU  Anesthesia Type:General  Level of Consciousness: drowsy  Airway & Oxygen Therapy: Patient Spontanous Breathing and Patient connected to nasal cannula oxygen  Post-op Assessment: Report given to RN and Post -op Vital signs reviewed and stable  Post vital signs: Reviewed and stable  Last Vitals:  Vitals Value Taken Time  BP 121/59 02/09/21 1548  Temp 36.4 C 02/09/21 1548  Pulse 88 02/09/21 1549  Resp 17 02/09/21 1549  SpO2 100 % 02/09/21 1549  Vitals shown include unvalidated device data.  Last Pain:  Vitals:   02/09/21 1257  TempSrc: Temporal  PainSc: 4          Complications: No notable events documented.

## 2021-02-09 NOTE — H&P (Signed)
Chief Complaint: Chief Complaint  Patient presents with   L1 Compression fracture   Lori Morris is a 60 y.o. female who presents today for evaluation of acute back pain that occurred 3 weeks ago. She was running about any option, she had a wave and came down on her buttocks and developed acute midline back pain. She is had some pain radiating along the ribs and superior iliac crest bilaterally with no numbness tingling radicular symptoms of the lower extremities. She was initially seen and had x-rays taken of the lumbar spine showing L1 compression fracture. MRI confirmed acute superior endplate compression fracture of L1. Her symptoms are 50 to 75% better. She is working as an Optometrist. She is getting by with Tylenol and ibuprofen. She gets good relief with Tylenol and ibuprofen. She has a history of osteoporosis but had recent bone density scan showing improved bone density. Initially she was on oxycodone but did not see any relief of this and got better relief with Tylenol and ibuprofen.  Today 02/06/2021 patient comes in for recheck of her lower back. She describes increasing back pain since her last visit 2 weeks ago. She is now 5 weeks out from an acute injury in which she developed an acute L1 compression fracture. Initially she was doing better but now she is not doing better and is seeing increasing pain. Tylenol and ibuprofen is given her little relief. No numbness tingling radicular symptoms. Patient is interfering with activities daily living.  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   Pure hypercholesterolemia 02/28/2017   RLS (restless legs syndrome)   Sleep apnea 2014  On cpap   Past Surgical History: Past Surgical History:   Procedure Laterality Date   HYSTERECTOMY   Past Family History: Family History  Problem Relation Age of Onset   Thyroid disease Mother   Endometriosis Mother   Mitral valve prolapse Father   Hyperlipidemia (Elevated cholesterol) Father   Heart disease Father   Kidney disease Father   Medications: Current Outpatient Medications Ordered in Epic  Medication Sig Dispense Refill   ALPRAZolam (XANAX) 0.25 MG tablet Take 1 tablet (0.25 mg total) by mouth 2 (two) times daily as needed for Sleep or Anxiety 90 tablet 1   cetirizine (ZYRTEC) 10 MG chewable tablet Take 1 tablet (10 mg total) by mouth once daily 90 tablet 3   cholecalciferol (VITAMIN D3) 1000 unit tablet Take by mouth.   dicyclomine (BENTYL) 20 mg tablet Take 1 tablet (20 mg total) by mouth 2 (two) times daily with meals 60 tablet 5   estradioL (ESTRACE) 0.5 MG tablet Take 1 tablet (0.5 mg total) by mouth once daily 90 tablet 3   fluticasone propionate (FLONASE) 50 mcg/actuation nasal spray Place 2 sprays into both nostrils once daily 16 g 1   hydroCHLOROthiazide (HYDRODIURIL) 25 MG tablet Take 1 tablet (25 mg total) by mouth once daily 90 tablet 3   ibuprofen (ADVIL,MOTRIN) 200 MG tablet Take 800 mg by mouth once daily as needed for Pain.   levothyroxine (SYNTHROID) 25 MCG tablet Take on an empty stomach with a glass of water at least 30-60 minutes before breakfast. 90 tablet 3   pantoprazole (PROTONIX) 20 MG DR tablet Take 1 tablet (20 mg total) by mouth once  daily 90 tablet 3   progesterone (PROMETRIUM) 100 MG capsule Take 1 capsule (100 mg total) by mouth once daily 90 capsule 1   rOPINIRole (REQUIP) 0.25 MG tablet Take 3 tablets by mouth once daily 270 tablet 3   venlafaxine (EFFEXOR-XR) 37.5 MG XR capsule Take 1 capsule (37.5 mg total) by mouth once daily 90 capsule 3   No current Epic-ordered facility-administered medications on file.   Allergies: Allergies  Allergen Reactions   Erythromycin Unknown   Neosporin  [Neomycin-Bacitracin-Polymyxin] Unknown    Review of Systems:  A comprehensive 14 point ROS was performed, reviewed by me today, and the pertinent orthopaedic findings are documented in the HPI.  Exam: BP (!) 140/82  Wt 80.3 kg (177 lb)  BMI 31.35 kg/m  General:  Well developed, well nourished, no apparent distress, normal affect, normal gait with no antalgic component.   HEENT: Head normocephalic, atraumatic, PERRL.   Abdomen: Soft, non tender, non distended, Bowel sounds present.  Heart: Examination of the heart reveals regular, rate, and rhythm. There is no murmur noted on ascultation. There is a normal apical pulse.  Lungs: Lungs are clear to auscultation. There is no wheeze, rhonchi, or crackles. There is normal expansion of bilateral chest walls.  Examination of the thoracolumbar spine shows minimal spinous process tenderness along the thoracolumbar junction with no paravertebral muscle tenderness. She is slightly tender along bilateral iliac crest with no rib impingement. She is nontender along the sacral region, SI joints. Good range of motion of both hips. She ambulates well with no antalgic gait no assistive devices. She does not appear to be in any distress.  EXAM:  MRI LUMBAR SPINE WITHOUT CONTRAST   TECHNIQUE:  Multiplanar, multisequence MR imaging of the lumbar spine was  performed. No intravenous contrast was administered.   COMPARISON:  MRI lumbar spine 01/12/2015. CT abdomen pelvis  03/30/2019   FINDINGS:  Segmentation:  Normal   Alignment:  Normal   Vertebrae: Mild compression fracture L1 involving the superior  endplate. Mild associated edema L1 vertebral body. This fracture is  new compared to prior studies and is consistent with a recent  fracture. No associated spinal stenosis. No other fracture or mass  identified.   Conus medullaris and cauda equina: Conus extends to the T12-L1  level. Conus and cauda equina appear normal.   Paraspinal and  other soft tissues: Negative for paraspinous mass or  adenopathy. 2 cm left adnexal cyst.   Disc levels:   T12-L1: Mild disc degeneration.  Negative for stenosis   L1-2: Negative   L2-3: Negative   L3-4: Small central disc protrusion.  Negative for stenosis   L4-5: Mild disc degeneration with shallow central disc protrusion.  Negative for stenosis   L5-S1: Small central disc protrusion.  Negative for stenosis.   IMPRESSION:  Mild compression fracture L1 with bone marrow edema consistent with  recent fracture. No significant related spinal stenosis   Mild lumbar degenerative changes as above.   Electronically Signed    By: Franchot Gallo M.D.    On: 01/22/2021 09:30  AP and lateral views of the lumbar spine are ordered interpreted by me in the office today. Impression: Patient has superior endplate compression deformity of the L1 vertebral body that appears to have increased when compared to previous x-rays from 01/11/2021. There is 25 to 30% loss of superior vertebral body height. No other evidence of compression fractures or deformity. No spondylolisthesis. Mild spondylosis with very mild to space degeneration.  Impression: Closed  compression fracture of body of L1 vertebra (CMS-HCC) [S32.010A] Closed compression fracture of body of L1 vertebra (CMS-HCC) (primary encounter diagnosis)  Plan:  54. 60 year old female with increasing superior endplate compression fracture at L1. Patient's had pain for 5 weeks but over the last 2 weeks pain has been increasing. X-rays today show increasing loss of vertebral body height at L1. Pain is moderate and interfering with quality of life and activities daily living. No relief with conservative treatment. Patient feels as if she cannot get comfortable. Risks, benefits, complications of a L1 kyphoplasty have been discussed with the patient. Patient has agreed and consented procedure with Dr. Hessie Knows on 02/09/2021.  This note was generated in  part with voice recognition software and I apologize for any typographical errors that were not detected and corrected.  Feliberto Gottron MPA-C   Electronically signed by Feliberto Gottron, [place] at 02/06/2021 3:37 PM EDT   Reviewed  H+P. No changes noted.

## 2021-02-09 NOTE — Op Note (Signed)
02/09/2021  3:46 PM  PATIENT:  Lori Morris   MRN: [id number]   PRE-OPERATIVE DIAGNOSIS:  closed wedge compression fracture of L1, traumatic   POST-OPERATIVE DIAGNOSIS:  closed wedge compression fracture of L1, traumatic   PROCEDURE:  Procedure(s): KYPHOPLASTY L1  SURGEON: Laurene Footman, MD   ASSISTANTS: None   ANESTHESIA:   local and MAC   EBL:  No intake/output data recorded.   BLOOD ADMINISTERED:none   DRAINS: none    LOCAL MEDICATIONS USED:  MARCAINE    and XYLOCAINE    SPECIMEN: L1 vertebral body biopsy   DISPOSITION OF SPECIMEN: Pathology   COUNTS:  YES   TOURNIQUET:  * No tourniquets in log *   IMPLANTS: Bone cement   DICTATION: .Dragon Dictation  patient was brought to the operating room and after adequate anesthesia was obtained the patient was placed prone.  C arm was brought in in good visualization of the affected level obtained on both AP and lateral projections.  After patient identification and timeout procedures were completed, local anesthetic was infiltrated with 10 cc 1% Xylocaine infiltrated subcutaneously.  This is done the area on the each side of the planned approach.  The back was then prepped and draped in the usual sterile manner and repeat timeout procedure carried out.  A spinal needle was brought down to the pedicle on the each side of L1 and a 50-50 mix of 1% Xylocaine half percent Sensorcaine with epinephrine total of 20 cc injected on each side.  After allowing this to set a small incision was made and the trocar was advanced into the vertebral body in an extrapedicular fashion.  Biopsy was obtained drilling was carried out balloon inserted with inflation to 2.5 cc on the right and 2.5 cc on the left.  When the cement was appropriate consistency 4.5 cc were injected on the right and 3.0 cc on the left into the vertebral body without extravasation, good fill superior to inferior endplates and from right to left sides along the inferior endplate.   After the cement had set the trochar was removed and permanent C-arm views obtained.  The wound was closed with Dermabond followed by Band-Aid   PLAN OF CARE: Discharge to home after recovery room   PATIENT DISPOSITION:  PACU - hemodynamically stable.

## 2021-02-09 NOTE — Anesthesia Preprocedure Evaluation (Signed)
Anesthesia Evaluation  Patient identified by MRN, date of birth, ID band Patient awake    Reviewed: Allergy & Precautions, H&P , NPO status , Patient's Chart, lab work & pertinent test results, reviewed documented beta blocker date and time   History of Anesthesia Complications Negative for: history of anesthetic complications  Airway Mallampati: II  TM Distance: >3 FB Neck ROM: full    Dental  (+) Dental Advidsory Given, Caps   Pulmonary neg shortness of breath, sleep apnea and Continuous Positive Airway Pressure Ventilation , neg COPD, Recent URI  (Covid positive 12/19/20), Resolved,    Pulmonary exam normal breath sounds clear to auscultation       Cardiovascular Exercise Tolerance: Good negative cardio ROS Normal cardiovascular exam Rhythm:regular Rate:Normal     Neuro/Psych PSYCHIATRIC DISORDERS Anxiety negative neurological ROS     GI/Hepatic Neg liver ROS, GERD  ,  Endo/Other  neg diabetesHypothyroidism   Renal/GU Renal disease (kidney stones)  negative genitourinary   Musculoskeletal   Abdominal   Peds  Hematology negative hematology ROS (+)   Anesthesia Other Findings Past Medical History: No date: Anxiety No date: DDD (degenerative disc disease), lumbar No date: GERD (gastroesophageal reflux disease) No date: History of kidney stones No date: Hypothyroidism No date: Osteopenia No date: Restless leg syndrome No date: Sleep apnea     Comment:  uses CPAP   Reproductive/Obstetrics negative OB ROS                             Anesthesia Physical Anesthesia Plan  ASA: 2  Anesthesia Plan: General   Post-op Pain Management:    Induction: Intravenous  PONV Risk Score and Plan: 3 and TIVA and Propofol infusion  Airway Management Planned: Natural Airway and Simple Face Mask  Additional Equipment:   Intra-op Plan:   Post-operative Plan:   Informed Consent: I have  reviewed the patients History and Physical, chart, labs and discussed the procedure including the risks, benefits and alternatives for the proposed anesthesia with the patient or authorized representative who has indicated his/her understanding and acceptance.     Dental Advisory Given  Plan Discussed with: Anesthesiologist, CRNA and Surgeon  Anesthesia Plan Comments:         Anesthesia Quick Evaluation

## 2021-02-10 ENCOUNTER — Encounter: Payer: Self-pay | Admitting: Orthopedic Surgery

## 2021-02-10 NOTE — Anesthesia Postprocedure Evaluation (Signed)
Anesthesia Post Note  Patient: Lori Morris  Procedure(s) Performed: L1 KYPHOPLASTY  Patient location during evaluation: PACU Anesthesia Type: General Level of consciousness: awake and alert Pain management: pain level controlled Vital Signs Assessment: post-procedure vital signs reviewed and stable Respiratory status: spontaneous breathing, nonlabored ventilation, respiratory function stable and patient connected to nasal cannula oxygen Cardiovascular status: blood pressure returned to baseline and stable Postop Assessment: no apparent nausea or vomiting Anesthetic complications: no   No notable events documented.   Last Vitals:  Vitals:   02/09/21 1640 02/09/21 1653  BP: 116/62 127/64  Pulse: 77 90  Resp: 15 18  Temp:  (!) 36.2 C  SpO2: 97% 100%    Last Pain:  Vitals:   02/09/21 1700  TempSrc:   PainSc: 5                  Martha Clan

## 2021-02-13 LAB — SURGICAL PATHOLOGY

## 2021-03-21 HISTORY — PX: OTHER SURGICAL HISTORY: SHX169

## 2021-08-24 ENCOUNTER — Telehealth: Payer: Self-pay | Admitting: Obstetrics and Gynecology

## 2021-08-24 NOTE — Telephone Encounter (Signed)
This pt is scheduled for her annual with you on 5/11.  Will you please refill her prescriptions until she has her annual with you?   ?

## 2021-08-27 ENCOUNTER — Other Ambulatory Visit: Payer: Self-pay | Admitting: Obstetrics and Gynecology

## 2021-08-27 DIAGNOSIS — N951 Menopausal and female climacteric states: Secondary | ICD-10-CM

## 2021-08-27 DIAGNOSIS — Z79890 Hormone replacement therapy (postmenopausal): Secondary | ICD-10-CM

## 2021-08-27 MED ORDER — PROGESTERONE MICRONIZED 100 MG PO CAPS: ORAL_CAPSULE | ORAL | 0 refills | Status: DC

## 2021-08-27 NOTE — Telephone Encounter (Signed)
Rx RF eRxd.  

## 2021-08-27 NOTE — Progress Notes (Signed)
Rx RF prometrium till 5/23 annual ?

## 2021-08-28 NOTE — Telephone Encounter (Signed)
Pt aware.

## 2021-09-26 NOTE — Progress Notes (Signed)
? ?PCP: Idelle Crouch, MD ? ? ?Chief Complaint  ?Patient presents with  ? Gynecologic Exam  ?  No concerns  ? ? ?HPI: ?     [name] is a 61 y.o. No obstetric history on file. who LMP was No LMP recorded. Patient has had a hysterectomy., presents today for her annual examination.  Her menses are absent due to menopause/lap supracx hyst due to leio and pelvic pain. She does not have PMB. ? ?She doesn't have vasomotor sx if she takes HRT. She uses estradiol 0.5 mg daily and prometrium 100 mg 6 nights on, 1 night off with sx control. Sx recur if she misses a dose. Wants to cont HRT. ? ?Sex activity: single partner, contraception - post menopausal status. She does have vaginal dryness, improved with lubricants.  ? ?Last Pap: 09/07/20  Results were: no abnormalities /neg HPV DNA.  ?Hx of STDs: none ? ?Last mammogram: 03/24/18  Results were: normal. Repeat in 12 months.  ?There is no FH of breast cancer. There is no FH of ovarian cancer. The patient does self-breast exams. ? ?Colonoscopy: colonoscopy 2014 without abnormalities.  Repeat due after 10 years.  ?DEXA 3/23 with PCP after fx; osteopenia in hip (on ERT) ? ?Tobacco use: The patient denies current or previous tobacco use. ?Alcohol use: none  ?No drug use ?Exercise: not active ? ?She does get adequate calcium but not enough Vitamin D in her diet. ? ?Labs with PCP. ?Having issues with hair loss and thinning. Hx of hypothyroidism and recent TSH levels WNL. Hx of low ferritin in past but pt is now postmenopausal. Pt also having wt gain and wonders about cortisol. Is not exercising.  ? ?Past Medical History:  ?Diagnosis Date  ? Anxiety   ? DDD (degenerative disc disease), lumbar   ? GERD (gastroesophageal reflux disease)   ? History of kidney stones   ? Hypothyroidism   ? Osteopenia   ? Restless leg syndrome   ? Sleep apnea   ? uses CPAP  ? ? ?Past Surgical History:  ?Procedure Laterality Date  ? COLONOSCOPY  2014  ? wnl repeat 10 yrs  ? KYPHOPLASTY N/A  02/09/2021  ? Procedure: L1 KYPHOPLASTY;  Surgeon: Hessie Knows, MD;  Location: [facility] ORS;  Service: Orthopedics;  Laterality: N/A;  ? OTHER SURGICAL HISTORY  03/2021  ? Cataract surgery both eyes  ? SUPRACERVICAL ABDOMINAL HYSTERECTOMY  2011  ? pelvic pain  ? ? ?Family History  ?Problem Relation Age of Onset  ? Hypothyroidism Mother   ? Heart disease Father   ? Hyperlipidemia Father   ? Heart disease Sister   ? Breast cancer Neg Hx   ? ? ?Social History  ? ?Socioeconomic History  ? Marital status: Married  ?  Spouse name: Christy Sartorius  ? Number of children: 0  ? Years of education: Not on file  ? Highest education level: Not on file  ?Occupational History  ? Not on file  ?Tobacco Use  ? Smoking status: Never  ? Smokeless tobacco: Never  ?Vaping Use  ? Vaping Use: Never used  ?Substance and Sexual Activity  ? Alcohol use: No  ?  Comment: rarely  ? Drug use: No  ? Sexual activity: Yes  ?  Partners: Male  ?  Birth control/protection: Surgical  ?  Comment: Hysterectomy  ?Other Topics Concern  ? Not on file  ?Social History Narrative  ? Not on file  ? ?Social Determinants of Health  ? ?Financial  Resource Strain: Not on file  ?Food Insecurity: Not on file  ?Transportation Needs: Not on file  ?Physical Activity: Not on file  ?Stress: Not on file  ?Social Connections: Not on file  ?Intimate Partner Violence: Not on file  ? ? ?Current Meds  ?Medication Sig  ? ALPRAZolam (XANAX) 0.25 MG tablet Take 0.25 mg by mouth 2 (two) times daily as needed.  ? B Complex Vitamins (VITAMIN B COMPLEX PO) Take by mouth.  ? cetirizine-pseudoephedrine (ZYRTEC-D) 5-120 MG tablet Take 1 tablet by mouth daily.  ? desonide (DESOWEN) 0.05 % lotion Apply topically 2 (two) times daily. (Patient taking differently: Apply topically 2 (two) times daily as needed.)  ? dicyclomine (BENTYL) 20 MG tablet Take 20 mg by mouth 2 (two) times daily as needed.  ? doxycycline (ORACEA) 40 MG capsule Take 1 capsule (40 mg total) by mouth daily. Take with food (Patient  taking differently: Take 40 mg by mouth every other day. Take with food)  ? hydrochlorothiazide (HYDRODIURIL) 25 MG tablet Take 25 mg by mouth daily as needed.  ? ketoconazole (NIZORAL) 2 % cream Apply to affected areas one - two times daily as needed for rash.  ? levothyroxine (SYNTHROID, LEVOTHROID) 25 MCG tablet Take 25 mcg by mouth daily.  ? Multiple Vitamin (MULTIVITAMIN PO) Take by mouth.  ? omeprazole (PRILOSEC) 20 MG capsule Take 20 mg by mouth daily.  ? Oxymetazoline HCl (RHOFADE) 1 % CREA Apply to affected areas every morning (Patient taking differently: as needed. Apply to affected areas every morning)  ? pantoprazole (PROTONIX) 20 MG tablet Take 20 mg by mouth daily.  ? rOPINIRole (REQUIP) 0.25 MG tablet Take 0.25 mg by mouth 2 (two) times daily.  ? [DISCONTINUED] estradiol (ESTRACE) 0.5 MG tablet Take 1 tablet (0.5 mg total) by mouth daily.  ? [DISCONTINUED] progesterone (PROMETRIUM) 100 MG capsule Take 1 cap nightly, 6 nights on, 1 night off  ? ? ? ? ?ROS: ? ?Review of Systems  ?Constitutional:  Negative for fatigue, fever and unexpected weight change.  ?Respiratory:  Negative for cough, shortness of breath and wheezing.   ?Cardiovascular:  Negative for chest pain, palpitations and leg swelling.  ?Gastrointestinal:  Negative for blood in stool, constipation, diarrhea, nausea and vomiting.  ?Endocrine: Negative for cold intolerance, heat intolerance and polyuria.  ?Genitourinary:  Negative for dyspareunia, dysuria, flank pain, frequency, genital sores, hematuria, menstrual problem, pelvic pain, urgency, vaginal bleeding, vaginal discharge and vaginal pain.  ?Musculoskeletal:  Negative for back pain, joint swelling and myalgias.  ?Skin:  Negative for rash.  ?Neurological:  Negative for dizziness, syncope, light-headedness, numbness and headaches.  ?Hematological:  Negative for adenopathy.  ?Psychiatric/Behavioral:  Negative for agitation, confusion, sleep disturbance and suicidal ideas. The patient is  not nervous/anxious.   ? ? ?Objective: ?BP 120/80   Ht '5\' 3"'$  (1.6 m)   Wt 185 lb (83.9 kg)   BMI 32.77 kg/m?  ? ? ?Physical Exam ?Constitutional:   ?   Appearance: She is well-developed.  ?Genitourinary:  ?   Vulva normal.  ?   Genitourinary Comments: UTERUS/CX SURG REM  ?   Right Labia: No rash, tenderness or lesions. ?   Left Labia: No tenderness, lesions or rash. ?   Vaginal cuff intact. ?   No vaginal discharge, erythema or tenderness.  ? ?   Right Adnexa: not tender and no mass present. ?   Left Adnexa: not tender and no mass present. ?   Cervix is absent.  ?  Uterus is absent.  ?Breasts: ?   Right: No mass, nipple discharge, skin change or tenderness.  ?   Left: No mass, nipple discharge, skin change or tenderness.  ?Neck:  ?   Thyroid: No thyromegaly.  ?Cardiovascular:  ?   Rate and Rhythm: Normal rate and regular rhythm.  ?   Heart sounds: Normal heart sounds. No murmur heard. ?Pulmonary:  ?   Effort: Pulmonary effort is normal.  ?   Breath sounds: Normal breath sounds.  ?Abdominal:  ?   Palpations: Abdomen is soft.  ?   Tenderness: There is no abdominal tenderness. There is no guarding.  ?Musculoskeletal:     ?   General: Normal range of motion.  ?   Cervical back: Normal range of motion.  ?Neurological:  ?   General: No focal deficit present.  ?   Mental Status: She is alert and oriented to person, place, and time.  ?   Cranial Nerves: No cranial nerve deficit.  ?Skin: ?   General: Skin is warm and dry.  ?Psychiatric:     ?   Mood and Affect: Mood normal.     ?   Behavior: Behavior normal.     ?   Thought Content: Thought content normal.     ?   Judgment: Judgment normal.  ?Vitals reviewed.  ? ? ?Assessment/Plan: ?Encounter for annual routine gynecological examination ? ?Encounter for screening mammogram for malignant neoplasm of breast - Plan: MM 3D SCREEN BREAST BILATERAL; encouraged pt to schedule mammo, particularly since on HRT ? ?Vasomotor symptoms due to menopause - Plan: progesterone  (PROMETRIUM) 100 MG capsule, estradiol (ESTRACE) 0.5 MG tablet; Rx RF, doing well. Can try 1/2 tab estradiol to wean down prn. Pt also with osteopenia so ERT helping with bones.  ? ?Hormone replacement therapy (HRT) - P

## 2021-09-28 ENCOUNTER — Encounter: Payer: Self-pay | Admitting: Obstetrics and Gynecology

## 2021-09-28 ENCOUNTER — Ambulatory Visit (INDEPENDENT_AMBULATORY_CARE_PROVIDER_SITE_OTHER): Payer: Managed Care, Other (non HMO) | Admitting: Obstetrics and Gynecology

## 2021-09-28 VITALS — BP 120/80 | Ht 63.0 in | Wt 185.0 lb

## 2021-09-28 DIAGNOSIS — Z1231 Encounter for screening mammogram for malignant neoplasm of breast: Secondary | ICD-10-CM | POA: Diagnosis not present

## 2021-09-28 DIAGNOSIS — Z79890 Hormone replacement therapy (postmenopausal): Secondary | ICD-10-CM

## 2021-09-28 DIAGNOSIS — N951 Menopausal and female climacteric states: Secondary | ICD-10-CM

## 2021-09-28 DIAGNOSIS — L659 Nonscarring hair loss, unspecified: Secondary | ICD-10-CM

## 2021-09-28 DIAGNOSIS — Z01419 Encounter for gynecological examination (general) (routine) without abnormal findings: Secondary | ICD-10-CM | POA: Diagnosis not present

## 2021-09-28 DIAGNOSIS — R635 Abnormal weight gain: Secondary | ICD-10-CM

## 2021-09-28 MED ORDER — PROGESTERONE MICRONIZED 100 MG PO CAPS: ORAL_CAPSULE | ORAL | 3 refills | Status: DC

## 2021-09-28 MED ORDER — ESTRADIOL 0.5 MG PO TABS: 0.5000 mg | ORAL_TABLET | Freq: Every day | ORAL | 3 refills | Status: DC

## 2021-09-28 NOTE — Patient Instructions (Addendum)
I value your feedback and you entrusting us with your care. If you get a [facility] patient survey, I would appreciate you taking the time to let us know about your experience today. Thank you!  [facility]: [phone]      

## 2021-09-29 LAB — CORTISOL: Cortisol: 9.6 ug/dL (ref 6.2–19.4)

## 2021-09-29 LAB — FERRITIN: Ferritin: 39 ng/mL (ref 15–150)

## 2021-11-02 ENCOUNTER — Other Ambulatory Visit: Payer: Self-pay | Admitting: Dermatology

## 2021-11-02 DIAGNOSIS — L719 Rosacea, unspecified: Secondary | ICD-10-CM

## 2021-11-07 ENCOUNTER — Ambulatory Visit: Payer: Managed Care, Other (non HMO) | Admitting: Dermatology

## 2021-11-07 DIAGNOSIS — L219 Seborrheic dermatitis, unspecified: Secondary | ICD-10-CM | POA: Diagnosis not present

## 2021-11-07 DIAGNOSIS — L719 Rosacea, unspecified: Secondary | ICD-10-CM

## 2021-11-07 DIAGNOSIS — Q825 Congenital non-neoplastic nevus: Secondary | ICD-10-CM | POA: Diagnosis not present

## 2021-11-07 DIAGNOSIS — D2271 Melanocytic nevi of right lower limb, including hip: Secondary | ICD-10-CM

## 2021-11-07 DIAGNOSIS — L814 Other melanin hyperpigmentation: Secondary | ICD-10-CM

## 2021-11-07 DIAGNOSIS — L578 Other skin changes due to chronic exposure to nonionizing radiation: Secondary | ICD-10-CM

## 2021-11-07 DIAGNOSIS — L820 Inflamed seborrheic keratosis: Secondary | ICD-10-CM | POA: Diagnosis not present

## 2021-11-07 DIAGNOSIS — L649 Androgenic alopecia, unspecified: Secondary | ICD-10-CM

## 2021-11-07 DIAGNOSIS — L821 Other seborrheic keratosis: Secondary | ICD-10-CM

## 2021-11-07 DIAGNOSIS — D229 Melanocytic nevi, unspecified: Secondary | ICD-10-CM

## 2021-11-07 DIAGNOSIS — Z1283 Encounter for screening for malignant neoplasm of skin: Secondary | ICD-10-CM | POA: Diagnosis not present

## 2021-11-07 DIAGNOSIS — D1800 Hemangioma unspecified site: Secondary | ICD-10-CM

## 2021-11-07 DIAGNOSIS — W57XXXA Bitten or stung by nonvenomous insect and other nonvenomous arthropods, initial encounter: Secondary | ICD-10-CM

## 2021-11-07 MED ORDER — DOXYCYCLINE HYCLATE 20 MG PO TABS
20.0000 mg | ORAL_TABLET | Freq: Two times a day (BID) | ORAL | 11 refills | Status: DC
  Filled 2022-10-23 (×2): qty 60, 30d supply, fill #0

## 2021-11-07 NOTE — Progress Notes (Unsigned)
Follow-Up Visit   Subjective  Lori Morris is a 61 y.o. female who presents for the following: Annual Exam (1 year tbse. Itchy ears, hx  of aks, hx of rosacea has used rhofade samples and doxycycline as needed for flares. ).  Bite reaction  Right inframammary Will watch Erythematous patch   The patient presents for Total-Body Skin Exam (TBSE) for skin cancer screening and mole check.  The patient has spots, moles and lesions to be evaluated, some may be new or changing and the patient has concerns that these could be cancer.   The following portions of the chart were reviewed this encounter and updated as appropriate:      Review of Systems: No other skin or systemic complaints except as noted in HPI or Assessment and Plan.   Objective  Well appearing patient in no apparent distress; mood and affect are within normal limits.  A full examination was performed including scalp, head, eyes, ears, nose, lips, neck, chest, axillae, abdomen, back, buttocks, bilateral upper extremities, bilateral lower extremities, hands, feet, fingers, toes, fingernails, and toenails. All findings within normal limits unless otherwise noted below.  nose and b/l malar cheeks Erythema with resolving inflammatory papule at right nasal tip, b/l malar cheeks, nose   Right Thigh - Anterior 4 cm light villaceous patch   left upper arm x1, left mid back x 1, mid sternum x 1 (3) Erythematous stuck-on, waxy papule or plaque  right 3rd toe 2.5 mm medium light brown macule    Assessment & Plan  Rosacea nose and b/l malar cheeks  nose and b/l malar cheeks   Chronic condition with duration or expected duration over one year. Condition is bothersome to patient. Currently flared.   Rosacea is a chronic progressive skin condition usually affecting the face of adults, causing redness and/or acne bumps. It is treatable but not curable. It sometimes affects the eyes (ocular rosacea) as well. It may respond  to topical and/or systemic medication and can flare with stress, sun exposure, alcohol, exercise and some foods.  Daily application of broad spectrum spf 30+ sunscreen to face is recommended to reduce flares.     Continue doxycycline ER 40 mg capsule by mouth daily as needed   Rhofade 1 % cream qam 2 samples given in office today    Rx's sent to [facility]    Doxycycline should be taken with food to prevent nausea. Do not lay down for 30 minutes after taking. Be cautious with sun exposure and use good sun protection while on this medication. Pregnant women should not take this medication.   doxycycline (PERIOSTAT) 20 MG tablet - nose and b/l malar cheeks Take 1 tablet (20 mg total) by mouth 2 (two) times daily. Take with food  Related Medications Oxymetazoline HCl (RHOFADE) 1 % CREA Apply to affected areas every morning  Vascular birthmark Right Thigh - Anterior  Right Thigh - Anterior   Benign, observe.    Inflamed seborrheic keratosis (3) left upper arm x1, left mid back x 1, mid sternum x 1  Vs aks    Symptomatic, irritating, patient would like treated.  Destruction of lesion - left upper arm x1, left mid back x 1, mid sternum x 1  Destruction method: cryotherapy   Informed consent: discussed and consent obtained   Lesion destroyed using liquid nitrogen: Yes   Region frozen until ice ball extended beyond lesion: Yes   Outcome: patient tolerated procedure well with no complications   Post-procedure  details: wound care instructions given   Additional details:  Prior to procedure, discussed risks of blister formation, small wound, skin dyspigmentation, or rare scar following cryotherapy. Recommend Vaseline ointment to treated areas while healing.   Seborrheic dermatitis Right Ear concha and postauricular crease  Seborrheic Dermatitis  -  is a chronic persistent rash characterized by pinkness and scaling most commonly of the mid face but also can occur on the  scalp (dandruff), ears; mid chest, mid back and groin.  It tends to be exacerbated by stress and cooler weather.  People who have neurologic disease may experience new onset or exacerbation of existing seborrheic dermatitis.  The condition is not curable but treatable and can be controlled.   Cont Desonide lotion to AA's 1 - 2 times to ears and face  PRN itch.     Related Medications desonide (DESOWEN) 0.05 % lotion Apply topically 2 (two) times daily.  Nevus right 3rd toe  Benign-appearing.  Observation.  Call clinic for new or changing lesions.  Recommend daily use of broad spectrum spf 30+ sunscreen to sun-exposed areas.     Androgenic alopecia Mid Frontal Scalp  Female Androgenic Alopecia is a chronic condition related to genetics and/or hormonal changes.  In women androgenetic alopecia is commonly associated with menopause but may occur any time after puberty.  It causes hair thinning primarily on the crown with widening of the part and temporal hairline recession.  Can use OTC Rogaine (minoxidil) 5% solution/foam as directed.  Oral treatments in female patients who have no contraindication may include : - Low dose oral minoxidil 1.25 - '5mg'$  daily - Spironolactone 50 - '100mg'$  bid - Finasteride 2.5 - 5 mg daily Adjunctive therapies include: - Low Level Laser Light Therapy (LLLT) - Platelet-rich plasma injections (PRP) - Hair Transplants or scalp reduction   Had labs checked normal results  No h/o breast cancer   Bp today 129/76  Lentigines - Scattered tan macules - Due to sun exposure - Benign-appearing, observe - Recommend daily broad spectrum sunscreen SPF 30+ to sun-exposed areas, reapply every 2 hours as needed. - Call for any changes  Seborrheic Keratoses - Stuck-on, waxy, tan-brown papules and/or plaques  - Benign-appearing - Discussed benign etiology and prognosis. - Observe - Call for any changes  Melanocytic Nevi - Tan-brown and/or pink-flesh-colored  symmetric macules and papules - Benign appearing on exam today - Observation - Call clinic for new or changing moles - Recommend daily use of broad spectrum spf 30+ sunscreen to sun-exposed areas.   Hemangiomas - Red papules - Discussed benign nature - Observe - Call for any changes  Actinic Damage - Chronic condition, secondary to cumulative UV/sun exposure - diffuse scaly erythematous macules with underlying dyspigmentation - Recommend daily broad spectrum sunscreen SPF 30+ to sun-exposed areas, reapply every 2 hours as needed.  - Staying in the shade or wearing long sleeves, sun glasses (UVA+UVB protection) and wide brim hats (4-inch brim around the entire circumference of the hat) are also recommended for sun protection.  - Call for new or changing lesions.  Skin cancer screening performed today. Return in about 1 year (around 11/08/2022) for TBSE. I, Ruthell Rummage, CMA, am acting as scribe for Brendolyn Patty, MD.

## 2021-11-07 NOTE — Patient Instructions (Addendum)
Doxycycline should be taken with food to prevent nausea. Do not lay down for 30 minutes after taking. Be cautious with sun exposure and use good sun protection while on this medication. Pregnant women should not take this medication.       For hair loss  Female Androgenic Alopecia is a chronic condition related to genetics and/or hormonal changes.  In women androgenetic alopecia is commonly associated with menopause but may occur any time after puberty.  It causes hair thinning primarily on the crown with widening of the part and temporal hairline recession.  Can use OTC Rogaine (minoxidil) 5% solution/foam as directed.  Oral treatments in female patients who have no contraindication may include : - Low dose oral minoxidil 1.25 - '5mg'$  daily - Spironolactone 50 - '100mg'$  bid - Finasteride 2.5 - 5 mg daily Adjunctive therapies include: - Low Level Laser Light Therapy (LLLT) - Platelet-rich plasma injections (PRP) - Hair Transplants or scalp reduction    Seborrheic Keratosis  What causes seborrheic keratoses? Seborrheic keratoses are harmless, common skin growths that first appear during adult life.  As time goes by, more growths appear.  Some people may develop a large number of them.  Seborrheic keratoses appear on both covered and uncovered body parts.  They are not caused by sunlight.  The tendency to develop seborrheic keratoses can be inherited.  They vary in color from skin-colored to gray, brown, or even black.  They can be either smooth or have a rough, warty surface.   Seborrheic keratoses are superficial and look as if they were stuck on the skin.  Under the microscope this type of keratosis looks like layers upon layers of skin.  That is why at times the top layer may seem to fall off, but the rest of the growth remains and re-grows.    Treatment Seborrheic keratoses do not need to be treated, but can easily be removed in the office.  Seborrheic keratoses often cause symptoms when  they rub on clothing or jewelry.  Lesions can be in the way of shaving.  If they become inflamed, they can cause itching, soreness, or burning.  Removal of a seborrheic keratosis can be accomplished by freezing, burning, or surgery. If any spot bleeds, scabs, or grows rapidly, please return to have it checked, as these can be an indication of a skin cancer.  Cryotherapy Aftercare  Wash gently with soap and water everyday.   Apply Vaseline and Band-Aid daily until healed.    Melanoma ABCDEs  Melanoma is the most dangerous type of skin cancer, and is the leading cause of death from skin disease.  You are more likely to develop melanoma if you: Have light-colored skin, light-colored eyes, or red or blond hair Spend a lot of time in the sun Tan regularly, either outdoors or in a tanning bed Have had blistering sunburns, especially during childhood Have a close family member who has had a melanoma Have atypical moles or large birthmarks  Early detection of melanoma is key since treatment is typically straightforward and cure rates are extremely high if we catch it early.   The first sign of melanoma is often a change in a mole or a new dark spot.  The ABCDE system is a way of remembering the signs of melanoma.  A for asymmetry:  The two halves do not match. B for border:  The edges of the growth are irregular. C for color:  A mixture of colors are present instead of an even  brown color. D for diameter:  Melanomas are usually (but not always) greater than 73m - the size of a pencil eraser. E for evolution:  The spot keeps changing in size, shape, and color.  Please check your skin once per month between visits. You can use a small mirror in front and a large mirror behind you to keep an eye on the back side or your body.   If you see any new or changing lesions before your next follow-up, please call to schedule a visit.  Please continue daily skin protection including broad spectrum  sunscreen SPF 30+ to sun-exposed areas, reapplying every 2 hours as needed when you're outdoors.   Staying in the shade or wearing long sleeves, sun glasses (UVA+UVB protection) and wide brim hats (4-inch brim around the entire circumference of the hat) are also recommended for sun protection.    Due to recent changes in healthcare laws, you may see results of your pathology and/or laboratory studies on MyChart before the doctors have had a chance to review them. We understand that in some cases there may be results that are confusing or concerning to you. Please understand that not all results are received at the same time and often the doctors may need to interpret multiple results in order to provide you with the best plan of care or course of treatment. Therefore, we ask that you please give u[place]2 business days to thoroughly review all your results before contacting the office for clarification. Should we see a critical lab result, you will be contacted sooner.   If You Need Anything After Your Visit  If you have any questions or concerns for your doctor, please call our main line at [phone] press option 4 to reach your doctor's medical assistant. If no one answers, please leave a voicemail as directed and we will return your call as soon as possible. Messages left after 4 pm will be answered the following business day.   You may also send [place] message via MUnion We typically respond to MyChart messages within 1-2 business days.  For prescription refills, please ask your pharmacy to contact our office. Our fax number is [phone]  If you have an urgent issue when the clinic is closed that cannot wait until the next business day, you can page your doctor at the number below.    Please note that while we do our best to be available for urgent issues outside of office hours, we are not available 24/7.   If you have an urgent issue and are unable to reach u[place] you may choose to seek  medical care at your doctor's office, retail clinic, urgent care center, or emergency room.  If you have a medical emergency, please immediately call 911 or go to the emergency department.  Pager Numbers  - Dr. KNehemiah Massed [phone] - Dr. MLaurence Ferrari [phone] - Dr. SNicole Kindred [phone] In the event of inclement weather, please call our main line at [phone] an update on the status of any delays or closures.  Dermatology Medication Tips: Please keep the boxes that topical medications come in in order to help keep track of the instructions about where and how to use these. Pharmacies typically print the medication instructions only on the boxes and not directly on the medication tubes.   If your medication is too expensive, please contact our office at [phone] 4 or send [place] message through MLos [place]   We are unable to  tell what your co-pay for medications will be in advance as this is different depending on your insurance coverage. However, we may be able to find a substitute medication at lower cost or fill out paperwork to get insurance to cover a needed medication.   If a prior authorization is required to get your medication covered by your insurance company, please allow [place] 1-2 business days to complete this process.  Drug prices often vary depending on where the prescription is filled and some pharmacies may offer cheaper prices.  The website www.goodrx.com contains coupons for medications through different pharmacies. The prices here do not account for what the cost may be with help from insurance (it may be cheaper with your insurance), but the website can give you the price if you did not use any insurance.  - You can print the associated coupon and take it with your prescription to the pharmacy.  - You may also stop by our office during regular business hours and pick up a GoodRx coupon card.  - If you need your prescription sent electronically to a  different pharmacy, notify our office through [facility] or by phone at [phone] option 4.     Si Usted Necesita Algo Despus de Su Visita  Tambin puede enviarnos un mensaje a travs de Pharmacist, community. Por lo general respondemos a los mensajes de MyChart en el transcurso de 1 a 2 das hbiles.  Para renovar recetas, por favor pida a su farmacia que se ponga en contacto con nuestra oficina. Harland Dingwall de fax es [place] [phone].  Si tiene un asunto urgente cuando la clnica est cerrada y que no puede esperar hasta el siguiente da hbil, puede llamar/localizar a su doctor(a) al nmero que aparece a continuacin.   Por favor, tenga en cuenta que aunque hacemos todo lo posible para estar disponibles para asuntos urgentes fuera del horario de [place], no estamos disponibles las 24 horas del da, los 7 das de la [place].   Si tiene un problema urgente y no puede comunicarse con nosotros, puede optar por buscar atencin mdica  en el consultorio de su doctor(a), en una clnica privada, en un centro de atencin urgente o en una sala de emergencias.  Si tiene Engineering geologist, por favor llame inmediatamente al 911 o vaya a la sala de emergencias.  Nmeros de bper  - Dr. Nehemiah Massed: [phone]  - Dra. Moye: [phone]  - Dra. Nicole Kindred: [phone]  En caso de inclemencias del [place], por favor llame a Johnsie Kindred principal al [phone] para una actualizacin sobre el [place] de cualquier retraso o cierre.  Consejos para la medicacin en dermatologa: Por favor, guarde las cajas en las que vienen los medicamentos de uso tpico para ayudarle a seguir las instrucciones sobre dnde y cmo usarlos. Las farmacias generalmente imprimen las instrucciones del medicamento slo en las cajas y no directamente en los tubos del [place].   Si su medicamento es muy caro, por favor, pngase en contacto con Zigmund Daniel llamando al [phone] y presione la opcin 4 o envenos un  mensaje a travs de Pharmacist, community.   No podemos decirle cul ser su copago por los medicamentos por adelantado ya que esto es diferente dependiendo de la cobertura de su seguro. Sin embargo, es posible que podamos encontrar un medicamento sustituto a Electrical engineer un formulario para que el seguro cubra el medicamento que se considera necesario.   Si se requiere una autorizacin previa para que su compaa de seguros [place]  su medicamento, por favor permtanos de 1 a 2 das hbiles para completar este proceso.  Los precios de los medicamentos varan con frecuencia dependiendo del Environmental consultant de dnde se surte la receta y alguna farmacias pueden ofrecer precios ms baratos.  El sitio web www.goodrx.com tiene cupones para medicamentos de Airline pilot. Los precios aqu no tienen en cuenta lo que podra costar con la ayuda del seguro (puede ser ms barato con su seguro), pero el sitio web puede darle el precio si no utiliz Research scientist (physical sciences).  - Puede imprimir el cupn correspondiente y llevarlo con su receta a la farmacia.  - Tambin puede pasar por nuestra oficina durante el horario de atencin regular y Charity fundraiser una tarjeta de cupones de GoodRx.  - Si necesita que su receta se enve electrnicamente a una farmacia diferente, informe a nuestra oficina a travs de MyChart de [place] o por telfono llamando al [phone] y presione la opcin 4.

## 2022-01-25 ENCOUNTER — Encounter: Payer: Self-pay | Admitting: Dermatology

## 2022-02-07 ENCOUNTER — Ambulatory Visit: Payer: Managed Care, Other (non HMO) | Admitting: Dermatology

## 2022-02-07 DIAGNOSIS — L649 Androgenic alopecia, unspecified: Secondary | ICD-10-CM

## 2022-02-07 MED ORDER — MINOXIDIL 2.5 MG PO TABS: 2.5000 mg | ORAL_TABLET | Freq: Every day | ORAL | 2 refills | Status: DC

## 2022-02-07 MED ORDER — FINASTERIDE 5 MG PO TABS: 5.0000 mg | ORAL_TABLET | Freq: Every day | ORAL | 2 refills | Status: DC

## 2022-02-07 NOTE — Progress Notes (Signed)
Follow-Up Visit   Subjective  Lori Morris is a 61 y.o. female who presents for the following: Follow-up.  Patient presents for follow-up androgenetic alopecia. She feels hair loss has improved some, but only in the last week. She thinks hair loss was coming from Omeprazole, which she started in January, and then hair loss began in February.  Since she stopped it a week ago, she has noticed less hair loss. She would like to discuss treatment options today. No history of any stress, sickness, or surgery. Labs have been normal.  The following portions of the chart were reviewed this encounter and updated as appropriate:       Review of Systems:  No other skin or systemic complaints except as noted in HPI or Assessment and Plan.  Objective  Well appearing patient in no apparent distress; mood and affect are within normal limits.  A focused examination was performed including face, scalp. Relevant physical exam findings are noted in the Assessment and Plan.  Scalp Thinning of the crown, frontal and temple hairline.               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  In women androgenetic alopecia is commonly associated with menopause but may occur any time after puberty.  It causes hair thinning primarily on the crown with widening of the part and temporal hairline recession.  Can use OTC Rogaine (minoxidil) 5% solution/foam as directed.  Oral treatments in female patients who have no contraindication may include : - Low dose oral minoxidil 1.25 - '5mg'$  daily - Spironolactone 50 - '100mg'$  bid - Finasteride 2.5 - 5 mg daily Adjunctive therapies include: - Low Level Laser Light Therapy (LLLT) - Platelet-rich plasma injections (PRP) - Hair  Transplants or scalp reduction  Checked 07/31/21 labs - normal results (TSH, Hb/Hct, Ferritin) Ferritin from 09/28/21 was 39. Discussed repeat labs. Defers for now.  BP 142/83  Start finasteride '5mg'$  take 1/2 tablet QD (written 1 po QD) dsp #30 2Rf Start minoxidil 2.'5mg'$  take 1/2 tablet daily (written 1 po QD) dsp #30 2Rf.  Doses of minoxidil for hair loss are considered 'low dose'. This is because the doses used for hair loss are a lot lower than the doses which are used for conditions such as high blood pressure (hypertension). The doses used for hypertension are 10-'40mg'$  per day.  Side effects are uncommon at the low doses (up to 2.5 mg/day) used to treat hair loss. Potential side effects, more commonly seen at higher doses, include: Increase in hair growth (hypertrichosis) elsewhere on face and body Temporary hair shedding upon starting medication which may last up to 4 weeks Ankle swelling, fluid retention, rapid weight gain more than 5 pounds Low blood pressure and feeling lightheaded or dizzy when standing up quickly Fast or irregular heartbeat Headaches     minoxidil (LONITEN) 2.5 MG tablet - Scalp Take 1 tablet (2.5 mg total) by mouth daily.  finasteride (PROSCAR) 5 MG tablet - Scalp Take 1 tablet (5 mg total) by mouth daily.   Return in about 3 months (around 05/09/2022) for alopecia.  IJamesetta Orleans, CMA, am acting as scribe for Brendolyn Patty, MD .  Documentation: I have reviewed the above documentation for accuracy and completeness, and I agree with the above.  Brendolyn Patty MD

## 2022-02-07 NOTE — Patient Instructions (Addendum)
Start minoxidil 2.'5mg'$  - take 1/2 tablet daily. Doses of minoxidil for hair loss are considered 'low dose'. This is because the doses used for hair loss are a lot lower than the doses which are used for conditions such as high blood pressure (hypertension). The doses used for hypertension are 10-'40mg'$  per day.  Side effects are uncommon at the low doses (up to 2.5 mg/day) used to treat hair loss. Potential side effects, more commonly seen at higher doses, include: Increase in hair growth (hypertrichosis) elsewhere on face and body Temporary hair shedding upon starting medication which may last up to 4 weeks Ankle swelling, fluid retention, rapid weight gain more than 5 pounds Low blood pressure and feeling lightheaded or dizzy when standing up quickly Fast or irregular heartbeat Headaches  Start finasteride '5mg'$  - take 1/2 tablet daily.    Due to recent changes in healthcare laws, you may see results of your pathology and/or laboratory studies on MyChart before the doctors have had a chance to review them. We understand that in some cases there may be results that are confusing or concerning to you. Please understand that not all results are received at the same time and often the doctors may need to interpret multiple results in order to provide you with the best plan of care or course of treatment. Therefore, we ask that you please give [place] 2 business days to thoroughly review all your results before contacting the office for clarification. Should we see a critical lab result, you will be contacted sooner.   If You Need Anything After Your Visit  If you have any questions or concerns for your doctor, please call our main line at [phone] and press option 4 to reach your doctor's medical assistant. If no one answers, please leave a voicemail as directed and we will return your call as soon as possible. Messages left after 4 pm will be answered the following business day.   You may also send [place] a  message via [place]. We typically respond to MyChart messages within 1-2 business days.  For prescription refills, please ask your pharmacy to contact our office. Our fax number is [phone].  If you have an urgent issue when the clinic is closed that cannot wait until the next business day, you can page your doctor at the number below.    Please note that while we do our best to be available for urgent issues outside of office hours, we are not available 24/7.   If you have an urgent issue and are unable to reach [place], you may choose to seek medical care at your doctor's office, retail clinic, urgent care center, or emergency room.  If you have a medical emergency, please immediately call 911 or go to the emergency department.  Pager Numbers  - Dr. Nehemiah Massed: [phone]  - Dr. Laurence Ferrari: [phone]  - Dr. Nicole Kindred: [phone]  In the event of inclement weather, please call our main line at [phone] for an update on the status of any delays or closures.  Dermatology Medication Tips: Please keep the boxes that topical medications come in in order to help keep track of the instructions about where and how to use these. Pharmacies typically print the medication instructions only on the boxes and not directly on the medication tubes.   If your medication is too expensive, please contact our office at [phone] option 4 or send [place] a message through [place].   We are unable to tell what your co-pay for medications  will be in advance as this is different depending on your insurance coverage. However, we may be able to find a substitute medication at lower cost or fill out paperwork to get insurance to cover a needed medication.   If a prior authorization is required to get your medication covered by your insurance company, please allow [place] 1-2 business days to complete this process.  Drug prices often vary depending on where the prescription is filled and some pharmacies may offer  cheaper prices.  The website www.goodrx.com contains coupons for medications through different pharmacies. The prices here do not account for what the cost may be with help from insurance (it may be cheaper with your insurance), but the website can give you the price if you did not use any insurance.  - You can print the associated coupon and take it with your prescription to the pharmacy.  - You may also stop by our office during regular business hours and pick up a GoodRx coupon card.  - If you need your prescription sent electronically to a different pharmacy, notify our office through [facility] or by phone at [phone] option 4.     Si Usted Necesita Algo Despus de Su Visita  Tambin puede enviarnos un mensaje a travs de Pharmacist, community. Por lo general respondemos a los mensajes de MyChart en el transcurso de 1 a 2 das hbiles.  Para renovar recetas, por favor pida a su farmacia que se ponga en contacto con nuestra oficina. Harland Dingwall de fax es [place] [phone].  Si tiene un asunto urgente cuando la clnica est cerrada y que no puede esperar hasta el siguiente da hbil, puede llamar/localizar a su doctor(a) al nmero que aparece a continuacin.   Por favor, tenga en cuenta que aunque hacemos todo lo posible para estar disponibles para asuntos urgentes fuera del horario de [place], no estamos disponibles las 24 horas del da, los 7 das de la [place].   Si tiene un problema urgente y no puede comunicarse con nosotros, puede optar por buscar atencin mdica  en el consultorio de su doctor(a), en una clnica privada, en un centro de atencin urgente o en una sala de emergencias.  Si tiene Engineering geologist, por favor llame inmediatamente al 911 o vaya a la sala de emergencias.  Nmeros de bper  - Dr. Nehemiah Massed: [phone]  - Dra. Moye: [phone]  - Dra. Nicole Kindred: [phone]  En caso de inclemencias del [place], por favor llame a Johnsie Kindred principal al  [phone] para una actualizacin sobre el [place] de cualquier retraso o cierre.  Consejos para la medicacin en dermatologa: Por favor, guarde las cajas en las que vienen los medicamentos de uso tpico para ayudarle a seguir las instrucciones sobre dnde y cmo usarlos. Las farmacias generalmente imprimen las instrucciones del medicamento slo en las cajas y no directamente en los tubos del [place].   Si su medicamento es muy caro, por favor, pngase en contacto con Zigmund Daniel llamando al [phone] y presione la opcin 4 o envenos un mensaje a travs de Pharmacist, community.   No podemos decirle cul ser su copago por los medicamentos por adelantado ya que esto es diferente dependiendo de la cobertura de su seguro. Sin embargo, es posible que podamos encontrar un medicamento sustituto a Electrical engineer un formulario para que el seguro cubra el medicamento que se considera necesario.   Si se requiere una autorizacin previa para que su compaa de seguros [place] su medicamento, por favor permtanos de  1 a 2 das hbiles para completar este proceso.  Los precios de los medicamentos varan con frecuencia dependiendo del Environmental consultant de dnde se surte la receta y alguna farmacias pueden ofrecer precios ms baratos.  El sitio web www.goodrx.com tiene cupones para medicamentos de Airline pilot. Los precios aqu no tienen en cuenta lo que podra costar con la ayuda del seguro (puede ser ms barato con su seguro), pero el sitio web puede darle el precio si no utiliz Research scientist (physical sciences).  - Puede imprimir el cupn correspondiente y llevarlo con su receta a la farmacia.  - Tambin puede pasar por nuestra oficina durante el horario de atencin regular y Charity fundraiser una tarjeta de cupones de GoodRx.  - Si necesita que su receta se enve electrnicamente a una farmacia diferente, informe a nuestra oficina a travs de MyChart de [place] o por telfono llamando al [phone] y presione la opcin 4.

## 2022-03-06 ENCOUNTER — Ambulatory Visit
Admission: RE | Admit: 2022-03-06 | Discharge: 2022-03-06 | Disposition: A | Discharge status: Home / Self Care | Payer: Managed Care, Other (non HMO) | Source: Ambulatory Visit | Attending: Obstetrics and Gynecology | Admitting: Obstetrics and Gynecology

## 2022-03-06 DIAGNOSIS — Z1231 Encounter for screening mammogram for malignant neoplasm of breast: Secondary | ICD-10-CM | POA: Diagnosis present

## 2022-04-23 ENCOUNTER — Ambulatory Visit: Payer: Managed Care, Other (non HMO) | Admitting: Dermatology

## 2022-04-23 VITALS — BP 146/85 | HR 74

## 2022-04-23 DIAGNOSIS — L649 Androgenic alopecia, unspecified: Secondary | ICD-10-CM | POA: Diagnosis not present

## 2022-04-23 MED ORDER — MINOXIDIL 2.5 MG PO TABS: 2.5000 mg | ORAL_TABLET | Freq: Every day | ORAL | 1 refills | Status: DC

## 2022-04-23 MED ORDER — FINASTERIDE 5 MG PO TABS: 5.0000 mg | ORAL_TABLET | Freq: Every day | ORAL | 1 refills | Status: DC

## 2022-04-23 NOTE — Patient Instructions (Signed)
Due to recent changes in healthcare laws, you may see results of your pathology and/or laboratory studies on MyChart before the doctors have had a chance to review them. We understand that in some cases there may be results that are confusing or concerning to you. Please understand that not all results are received at the same time and often the doctors may need to interpret multiple results in order to provide you with the best plan of care or course of treatment. Therefore, we ask that you please give us 2 business days to thoroughly review all your results before contacting the office for clarification. Should we see a critical lab result, you will be contacted sooner.   If You Need Anything After Your Visit  If you have any questions or concerns for your doctor, please call our main line at [phone] and press option 4 to reach your doctor's medical assistant. If no one answers, please leave a voicemail as directed and we will return your call as soon as possible. Messages left after 4 pm will be answered the following business day.   You may also send us a message via MyChart. We typically respond to MyChart messages within 1-2 business days.  For prescription refills, please ask your pharmacy to contact our office. Our fax number is [phone].  If you have an urgent issue when the clinic is closed that cannot wait until the next business day, you can page your doctor at the number below.    Please note that while we do our best to be available for urgent issues outside of office hours, we are not available 24/7.   If you have an urgent issue and are unable to reach us, you may choose to seek medical care at your doctor's office, retail clinic, urgent care center, or emergency room.  If you have a medical emergency, please immediately call 911 or go to the emergency department.  Pager Numbers  - Dr. Kowalski: [phone]  - Dr. Moye: [phone]  - Dr. Stewart:  [phone]  In the event of inclement weather, please call our main line at [phone] for an update on the status of any delays or closures.  Dermatology Medication Tips: Please keep the boxes that topical medications come in in order to help keep track of the instructions about where and how to use these. Pharmacies typically print the medication instructions only on the boxes and not directly on the medication tubes.   If your medication is too expensive, please contact our office at [phone] option 4 or send us a message through MyChart.   We are unable to tell what your co-pay for medications will be in advance as this is different depending on your insurance coverage. However, we may be able to find a substitute medication at lower cost or fill out paperwork to get insurance to cover a needed medication.   If a prior authorization is required to get your medication covered by your insurance company, please allow us 1-2 business days to complete this process.  Drug prices often vary depending on where the prescription is filled and some pharmacies may offer cheaper prices.  The website www.goodrx.com contains coupons for medications through different pharmacies. The prices here do not account for what the cost may be with help from insurance (it may be cheaper with your insurance), but the website can give you the price if you did not use any insurance.  - You can print the associated coupon and take it with   your prescription to the pharmacy.  - You may also stop by our office during regular business hours and pick up a GoodRx coupon card.  - If you need your prescription sent electronically to a different pharmacy, notify our office through [place] MyChart or by phone at [phone] option 4.     Si Usted Necesita Algo Despus de Su Visita  Tambin puede enviarnos un mensaje a travs de MyChart. Por lo general respondemos a los mensajes de MyChart en el transcurso de 1 a 2  das hbiles.  Para renovar recetas, por favor pida a su farmacia que se ponga en contacto con nuestra oficina. Nuestro nmero de fax es el [phone].  Si tiene un asunto urgente cuando la clnica est cerrada y que no puede esperar hasta el siguiente da hbil, puede llamar/localizar a su doctor(a) al nmero que aparece a continuacin.   Por favor, tenga en cuenta que aunque hacemos todo lo posible para estar disponibles para asuntos urgentes fuera del horario de oficina, no estamos disponibles las 24 horas del da, los 7 das de la semana.   Si tiene un problema urgente y no puede comunicarse con nosotros, puede optar por buscar atencin mdica  en el consultorio de su doctor(a), en una clnica privada, en un centro de atencin urgente o en una sala de emergencias.  Si tiene una emergencia mdica, por favor llame inmediatamente al 911 o vaya a la sala de emergencias.  Nmeros de bper  - Dr. Kowalski: [phone]  - Dra. Moye: [phone]  - Dra. Stewart: [phone]  En caso de inclemencias del tiempo, por favor llame a nuestra lnea principal al [phone] para una actualizacin sobre el estado de cualquier retraso o cierre.  Consejos para la medicacin en dermatologa: Por favor, guarde las cajas en las que vienen los medicamentos de uso tpico para ayudarle a seguir las instrucciones sobre dnde y cmo usarlos. Las farmacias generalmente imprimen las instrucciones del medicamento slo en las cajas y no directamente en los tubos del medicamento.   Si su medicamento es muy caro, por favor, pngase en contacto con nuestra oficina llamando al [phone] y presione la opcin 4 o envenos un mensaje a travs de MyChart.   No podemos decirle cul ser su copago por los medicamentos por adelantado ya que esto es diferente dependiendo de la cobertura de su seguro. Sin embargo, es posible que podamos encontrar un medicamento sustituto a menor costo o llenar un formulario para que el  seguro cubra el medicamento que se considera necesario.   Si se requiere una autorizacin previa para que su compaa de seguros cubra su medicamento, por favor permtanos de 1 a 2 das hbiles para completar este proceso.  Los precios de los medicamentos varan con frecuencia dependiendo del lugar de dnde se surte la receta y alguna farmacias pueden ofrecer precios ms baratos.  El sitio web www.goodrx.com tiene cupones para medicamentos de diferentes farmacias. Los precios aqu no tienen en cuenta lo que podra costar con la ayuda del seguro (puede ser ms barato con su seguro), pero el sitio web puede darle el precio si no utiliz ningn seguro.  - Puede imprimir el cupn correspondiente y llevarlo con su receta a la farmacia.  - Tambin puede pasar por nuestra oficina durante el horario de atencin regular y recoger una tarjeta de cupones de GoodRx.  - Si necesita que su receta se enve electrnicamente a una farmacia diferente, informe a nuestra oficina a travs de MyChart de [place]   o por telfono llamando al [phone] y presione la opcin 4.  

## 2022-04-23 NOTE — Progress Notes (Signed)
   Follow-Up Visit   Subjective  Lori Morris is a 61 y.o. female who presents for the following: androgenetic alopecia (Scalp, Finasteride '5mg'$  1/2 po qd, Minoxidil 2.'5mg'$  1/2 po qd, pt notices mild improvement, no side effects).   The following portions of the chart were reviewed this encounter and updated as appropriate:       Review of Systems:  No other skin or systemic complaints except as noted in HPI or Assessment and Plan.  Objective  Well appearing patient in no apparent distress; mood and affect are within normal limits.  A focused examination was performed including scalp. Relevant physical exam findings are noted in the Assessment and Plan.  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 Chronic and persistent condition with duration or expected duration over one year. Condition is symptomatic/ bothersome to patient. Not currently at goal.   Female Androgenic Alopecia is a chronic condition related to genetics and/or hormonal changes.  In women androgenetic alopecia is commonly associated with menopause but may occur any time after puberty.  It causes hair thinning primarily on the crown with widening of the part and temporal hairline recession.  Can use OTC Rogaine (minoxidil) 5% solution/foam as directed.  Oral treatments in female patients who have no contraindication may include : - Low dose oral minoxidil 1.25 - '5mg'$  daily - Spironolactone 50 - '100mg'$  bid - Finasteride 2.5 - 5 mg daily Adjunctive therapies include: - Low Level Laser Light Therapy (LLLT) - Platelet-rich plasma injections (PRP) - Hair Transplants or scalp reduction   Increase Minoxidil 2.5 mg to 1 po qd Increase Finasteride '5mg'$  to 1 po qd  Doses of minoxidil for hair loss  are considered 'low dose'. This is because the doses used for hair loss are a lot lower than the doses which are used for conditions such as high blood pressure (hypertension). The doses used for hypertension are 10-'40mg'$  per day.  Side effects are uncommon at the low doses (up to 2.5 mg/day) used to treat hair loss. Potential side effects, more commonly seen at higher doses, include: Increase in hair growth (hypertrichosis) elsewhere on face and body Temporary hair shedding upon starting medication which may last up to 4 weeks Ankle swelling, fluid retention, rapid weight gain more than 5 pounds Low blood pressure and feeling lightheaded or dizzy when standing up quickly Fast or irregular heartbeat Headaches    minoxidil (LONITEN) 2.5 MG tablet - Scalp Take 1 tablet (2.5 mg total) by mouth daily.  finasteride (PROSCAR) 5 MG tablet - Scalp Take 1 tablet (5 mg total) by mouth daily.   Return for as scheduled.  I, Othelia Pulling, RMA, am acting as scribe for Brendolyn Patty, MD .  Documentation: I have reviewed the above documentation for accuracy and completeness, and I agree with the above.  Brendolyn Patty MD

## 2022-08-16 ENCOUNTER — Encounter: Payer: Self-pay | Admitting: Obstetrics and Gynecology

## 2022-10-11 ENCOUNTER — Other Ambulatory Visit: Payer: Self-pay | Admitting: Obstetrics and Gynecology

## 2022-10-11 DIAGNOSIS — Z79890 Hormone replacement therapy (postmenopausal): Secondary | ICD-10-CM

## 2022-10-11 DIAGNOSIS — N951 Menopausal and female climacteric states: Secondary | ICD-10-CM

## 2022-10-22 ENCOUNTER — Other Ambulatory Visit: Payer: Self-pay

## 2022-10-23 ENCOUNTER — Other Ambulatory Visit: Payer: Self-pay

## 2022-10-23 MED ORDER — LEVOTHYROXINE SODIUM 25 MCG PO TABS
25.0000 ug | ORAL_TABLET | Freq: Every day | ORAL | 3 refills | Status: AC
  Filled 2022-10-23: qty 90, 90d supply, fill #0
  Filled 2022-12-30 – 2023-01-13 (×2): qty 90, 90d supply, fill #1
  Filled 2023-07-14: qty 90, 90d supply, fill #2
  Filled 2023-10-09: qty 90, 90d supply, fill #3

## 2022-10-23 MED ORDER — VENLAFAXINE HCL ER 37.5 MG PO CP24
37.5000 mg | ORAL_CAPSULE | Freq: Every day | ORAL | 1 refills | Status: DC
  Filled 2022-10-23: qty 90, 90d supply, fill #0

## 2022-10-23 MED ORDER — PANTOPRAZOLE SODIUM 20 MG PO TBEC
20.0000 mg | DELAYED_RELEASE_TABLET | Freq: Every day | ORAL | 3 refills | Status: DC
  Filled 2022-10-23: qty 90, 90d supply, fill #0

## 2022-10-23 MED ORDER — ROPINIROLE HCL 0.25 MG PO TABS
0.7500 mg | ORAL_TABLET | Freq: Every day | ORAL | 3 refills | Status: DC
  Filled 2022-10-23 – 2022-11-21 (×2): qty 270, 90d supply, fill #0

## 2022-10-29 ENCOUNTER — Other Ambulatory Visit: Payer: Self-pay

## 2022-10-29 MED ORDER — FINASTERIDE 5 MG PO TABS
5.0000 mg | ORAL_TABLET | Freq: Every day | ORAL | 1 refills | Status: DC
  Filled 2022-10-29: qty 90, 90d supply, fill #0

## 2022-10-29 MED ORDER — MINOXIDIL 2.5 MG PO TABS
2.5000 mg | ORAL_TABLET | Freq: Every day | ORAL | 1 refills | Status: DC
  Filled 2022-10-29: qty 90, 90d supply, fill #0

## 2022-11-20 ENCOUNTER — Other Ambulatory Visit: Payer: Self-pay

## 2022-11-20 ENCOUNTER — Encounter: Payer: Self-pay | Admitting: Dermatology

## 2022-11-20 ENCOUNTER — Ambulatory Visit (INDEPENDENT_AMBULATORY_CARE_PROVIDER_SITE_OTHER): Payer: Commercial Managed Care - PPO | Admitting: Dermatology

## 2022-11-20 VITALS — BP 132/77

## 2022-11-20 DIAGNOSIS — Z872 Personal history of diseases of the skin and subcutaneous tissue: Secondary | ICD-10-CM

## 2022-11-20 DIAGNOSIS — L649 Androgenic alopecia, unspecified: Secondary | ICD-10-CM | POA: Diagnosis not present

## 2022-11-20 DIAGNOSIS — Z1283 Encounter for screening for malignant neoplasm of skin: Secondary | ICD-10-CM

## 2022-11-20 DIAGNOSIS — L820 Inflamed seborrheic keratosis: Secondary | ICD-10-CM

## 2022-11-20 DIAGNOSIS — L814 Other melanin hyperpigmentation: Secondary | ICD-10-CM

## 2022-11-20 DIAGNOSIS — Q825 Congenital non-neoplastic nevus: Secondary | ICD-10-CM | POA: Diagnosis not present

## 2022-11-20 DIAGNOSIS — L719 Rosacea, unspecified: Secondary | ICD-10-CM

## 2022-11-20 DIAGNOSIS — L578 Other skin changes due to chronic exposure to nonionizing radiation: Secondary | ICD-10-CM | POA: Diagnosis not present

## 2022-11-20 DIAGNOSIS — D229 Melanocytic nevi, unspecified: Secondary | ICD-10-CM | POA: Diagnosis not present

## 2022-11-20 DIAGNOSIS — L821 Other seborrheic keratosis: Secondary | ICD-10-CM

## 2022-11-20 MED ORDER — MINOXIDIL 2.5 MG PO TABS
2.5000 mg | ORAL_TABLET | Freq: Every day | ORAL | 1 refills | Status: AC
  Filled 2022-11-20 – 2023-02-28 (×2): qty 90, 90d supply, fill #0
  Filled 2023-07-14: qty 90, 90d supply, fill #1

## 2022-11-20 MED ORDER — FINASTERIDE 5 MG PO TABS
5.0000 mg | ORAL_TABLET | Freq: Every day | ORAL | 1 refills | Status: AC
  Filled 2022-11-20 – 2023-02-28 (×2): qty 90, 90d supply, fill #0
  Filled 2023-07-14: qty 90, 90d supply, fill #1

## 2022-11-20 MED ORDER — AZELAIC ACID 15 % EX GEL
1.0000 | CUTANEOUS | 11 refills | Status: DC
  Filled 2022-11-20: qty 50, 30d supply, fill #0

## 2022-11-20 NOTE — Progress Notes (Signed)
Follow-Up Visit   Subjective  Lori Morris is a 62 y.o. female who presents for the following: Skin Cancer Screening and Full Body Skin Exam, Androgenetic alopecia Minoxidil 2.5mg  1/2 po every day  Finasteride 5mg  1/2 po every day- hair regrowth going well, hx of Aks face, Rosacea face, Doxycycline 20mg  1 po every day.  Still gets some bumps, doesn't use topical.  The patient presents for Total-Body Skin Exam (TBSE) for skin cancer screening and mole check. The patient has spots, moles and lesions to be evaluated, some may be new or changing and the patient has concerns that these could be cancer.  Some are irritated on legs.    The following portions of the chart were reviewed this encounter and updated as appropriate: medications, allergies, medical history  Review of Systems:  No other skin or systemic complaints except as noted in HPI or Assessment and Plan.  Objective  Well appearing patient in no apparent distress; mood and affect are within normal limits.  A full examination was performed including scalp, head, eyes, ears, nose, lips, neck, chest, axillae, abdomen, back, buttocks, bilateral upper extremities, bilateral lower extremities, hands, feet, fingers, toes, fingernails, and toenails. All findings within normal limits unless otherwise noted below.   Relevant physical exam findings are noted in the Assessment and Plan.  L ant thigh x 1, R pretibia x 1 (2) Stuck on waxy paps with erythema    Assessment & Plan   LENTIGINES, SEBORRHEIC KERATOSES, HEMANGIOMAS - Benign normal skin lesions - Benign-appearing - Call for any changes  MELANOCYTIC NEVI - Tan-brown and/or pink-flesh-colored symmetric macules and papules - Benign appearing on exam today - Observation - Call clinic for new or changing moles - Recommend daily use of broad spectrum spf 30+ sunscreen to sun-exposed areas.   ACTINIC DAMAGE - Chronic condition, secondary to cumulative UV/sun exposure -  diffuse scaly erythematous macules with underlying dyspigmentation - Recommend daily broad spectrum sunscreen SPF 30+ to sun-exposed areas, reapply every 2 hours as needed.  - Staying in the shade or wearing long sleeves, sun glasses (UVA+UVB protection) and wide brim hats (4-inch brim around the entire circumference of the hat) are also recommended for sun protection.  - Call for new or changing lesions. - Sample of otc Retinol given to patient, sample of Hydro boost moisturizer given to patient.  SKIN CANCER SCREENING PERFORMED TODAY.  ANDROGENETIC ALOPECIA (FEMALE PATTERN HAIR LOSS) Exam: Diffuse thinning of the crown and widening of the midline part with retention of the frontal hairline.  Increased hair growth noted when compared with baseline photos at BL temporal scalp.  Chronic condition with duration or expected duration over one year. Currently well-controlled.   Female Androgenic Alopecia is a chronic condition related to genetics and/or hormonal changes.  In women androgenetic alopecia is commonly associated with menopause but may occur any time after puberty.  It causes hair thinning primarily on the crown with widening of the part and temporal hairline recession.  Can use OTC Rogaine (minoxidil) 5% solution/foam as directed.  Oral treatments in female patients who have no contraindication may include : - Low dose oral minoxidil 1.25 - 5mg  daily - Spironolactone 50 - 100mg  bid - Finasteride 2.5 - 5 mg daily Adjunctive therapies include: - Low Level Laser Light Therapy (LLLT) - Platelet-rich plasma injections (PRP) - Hair Transplants or scalp reduction   Treatment Plan: Cont Minoxidil 2.5mg  1/2 po every day Cont Finasteride 5mg  1/2 po every day  Doses of minoxidil for  hair loss are considered 'low dose'. This is because the doses used for hair loss are much lower than the doses which are used for conditions such as high blood pressure (hypertension). The doses used for  hypertension are 10-40mg  per day.  Side effects are uncommon at the low doses (up to 2.5 mg/day) used to treat hair loss. Potential side effects, more commonly seen at higher doses, include: Increase in hair growth (hypertrichosis) elsewhere on face and body Temporary hair shedding upon starting medication which may last up to 4 weeks Ankle swelling, fluid retention, rapid weight gain more than 5 pounds Low blood pressure and feeling lightheaded or dizzy when standing up quickly Fast or irregular heartbeat Headaches   ROSACEA Exam Mid face erythema nose and cheeks, inflammatory pap glabella, resolving inflammatory pap L post auricular neck  Chronic and persistent condition with duration or expected duration over one year. Condition is symptomatic/ bothersome to patient. Not currently at goal.   Rosacea is a chronic progressive skin condition usually affecting the face of adults, causing redness and/or acne bumps. It is treatable but not curable. It sometimes affects the eyes (ocular rosacea) as well. It may respond to topical and/or systemic medication and can flare with stress, sun exposure, alcohol, exercise, topical steroids (including hydrocortisone/cortisone 10) and some foods.  Daily application of broad spectrum spf 30+ sunscreen to face is recommended to reduce flares.  Patient denies grittiness of the eyes  Treatment Plan Increase Doxycycline 20mg  to 2 po daily with food and drink   Start Finacea gel daily/bid to face  Doxycycline should be taken with food to prevent nausea. Do not lay down for 30 minutes after taking. Be cautious with sun exposure and use good sun protection while on this medication. Pregnant women should not take this medication.    Inflamed seborrheic keratosis (2) L ant thigh x 1, R pretibia x 1  Symptomatic, irritating, patient would like treated.   Destruction of lesion - L ant thigh x 1, R pretibia x 1  Destruction method: cryotherapy   Informed  consent: discussed and consent obtained   Lesion destroyed using liquid nitrogen: Yes   Region frozen until ice ball extended beyond lesion: Yes   Outcome: patient tolerated procedure well with no complications   Post-procedure details: wound care instructions given   Additional details:  Prior to procedure, discussed risks of blister formation, small wound, skin dyspigmentation, or rare scar following cryotherapy. Recommend Vaseline ointment to treated areas while healing.     VASCULAR BIRTHMARK Exam: violaceous patch R ant thigh  Treatment Plan: Benign-appearing.  Observation.  Call clinic for new or changing moles.  Recommend daily use of broad spectrum spf 30+ sunscreen to sun-exposed areas.     HISTORY OF PRECANCEROUS ACTINIC KERATOSIS - site(s) of PreCancerous Actinic Keratosis clear today. - these may recur and new lesions may form requiring treatment to prevent transformation into skin cancer - observe for new or changing spots and contact [facility] for appointment if occur - photoprotection with sun protective clothing; sunglasses and broad spectrum sunscreen with SPF of at least 30 + and frequent self skin exams recommended - yearly exams by a dermatologist recommended for persons with history of PreCancerous Actinic Keratoses   Return in about 1 year (around 11/20/2023) for TBSE, Hx of AKs.  I, Ardis Rowan, RMA, am acting as scribe for Willeen Niece, MD .   Documentation: I have reviewed the above documentation for accuracy and completeness, and I agree with  the above.  Willeen Niece, MD

## 2022-11-20 NOTE — Patient Instructions (Addendum)
Cryotherapy Aftercare  Wash gently with soap and water everyday.   Apply Vaseline and Band-Aid daily until healed.   Basic OTC daily skin care regimen to prevent photoaging:   Recommend facial moisturizer with sunscreen SPF 30 every morning (OTC brands include CeraVe AM, Neutrogena, Eucerin, Cetaphil, Aveeno, La Roche Posay).  Can also apply a topical Vit C serum which is an antioxidant (OTC brands include CeraVe, La Roche Posay, and The Ordinary) underneath sunscreen in morning. If you are outside during the day in the summer for extended periods, especially swimming and/or sweating, make sure you apply a water resistant facial sunscreen lotion spf 30 or higher.   At night recommend a cream with retinol (a vitamin A derivative which stimulates collagen production) like CeraVe skin renewing retinol serum or ROC retinol correxion cream or Neutrogena rapid wrinkle repair cream. Retinol may cause skin irritation in people with sensitive skin.  Can use it every other day and/or apply on top of a hyaluronic acid (HA) moisturizer/serum (Neutrogena Hydroboost water cream) if better tolerated that way.  Retinol may also help with lightening brown spots.   Our office sells high quality, medically tested skin care lines such as Elta MD sunscreens (with Zinc), and Alastin skin care products, which are very effective in treating photoaging. The Alastin line includes cosmeceutical grade Vit.C serum, HA serum, Elastin stimulating moisturizers/serums, lightening serum, and sunscreens.  If you want prescription treatment, then you would need an appointment (Rx tretinoin and fade creams, Botox, filler injections, laser treatments, etc.) These prescriptions and procedures are not covered by insurance but work very well.  Due to recent changes in healthcare laws, you may see results of your pathology and/or laboratory studies on MyChart before the doctors have had a chance to review them. We understand that in some  cases there may be results that are confusing or concerning to you. Please understand that not all results are received at the same time and often the doctors may need to interpret multiple results in order to provide you with the best plan of care or course of treatment. Therefore, we ask that you please give us 2 business days to thoroughly review all your results before contacting the office for clarification. Should we see a critical lab result, you will be contacted sooner.   If You Need Anything After Your Visit  If you have any questions or concerns for your doctor, please call our main line at [phone] and press option 4 to reach your doctor's medical assistant. If no one answers, please leave a voicemail as directed and we will return your call as soon as possible. Messages left after 4 pm will be answered the following business day.   You may also send us a message via MyChart. We typically respond to MyChart messages within 1-2 business days.  For prescription refills, please ask your pharmacy to contact our office. Our fax number is [phone].  If you have an urgent issue when the clinic is closed that cannot wait until the next business day, you can page your doctor at the number below.    Please note that while we do our best to be available for urgent issues outside of office hours, we are not available 24/7.   If you have an urgent issue and are unable to reach us, you may choose to seek medical care at your doctor's office, retail clinic, urgent care center, or emergency room.  If you have a medical emergency, please immediately call 911   or go to the emergency department.  Pager Numbers  - Dr. Kowalski: [phone]  - Dr. Moye: [phone]  - Dr. Stewart: [phone]  In the event of inclement weather, please call our main line at [phone] for an update on the status of any delays or closures.  Dermatology Medication Tips: Please keep the boxes that  topical medications come in in order to help keep track of the instructions about where and how to use these. Pharmacies typically print the medication instructions only on the boxes and not directly on the medication tubes.   If your medication is too expensive, please contact our office at [phone] option 4 or send us a message through MyChart.   We are unable to tell what your co-pay for medications will be in advance as this is different depending on your insurance coverage. However, we may be able to find a substitute medication at lower cost or fill out paperwork to get insurance to cover a needed medication.   If a prior authorization is required to get your medication covered by your insurance company, please allow us 1-2 business days to complete this process.  Drug prices often vary depending on where the prescription is filled and some pharmacies may offer cheaper prices.  The website www.goodrx.com contains coupons for medications through different pharmacies. The prices here do not account for what the cost may be with help from insurance (it may be cheaper with your insurance), but the website can give you the price if you did not use any insurance.  - You can print the associated coupon and take it with your prescription to the pharmacy.  - You may also stop by our office during regular business hours and pick up a GoodRx coupon card.  - If you need your prescription sent electronically to a different pharmacy, notify our office through [place] MyChart or by phone at [phone] option 4.     Si Usted Necesita Algo Despus de Su Visita  Tambin puede enviarnos un mensaje a travs de MyChart. Por lo general respondemos a los mensajes de MyChart en el transcurso de 1 a 2 das hbiles.  Para renovar recetas, por favor pida a su farmacia que se ponga en contacto con nuestra oficina. Nuestro nmero de fax es el [phone].  Si tiene un asunto urgente cuando la clnica  est cerrada y que no puede esperar hasta el siguiente da hbil, puede llamar/localizar a su doctor(a) al nmero que aparece a continuacin.   Por favor, tenga en cuenta que aunque hacemos todo lo posible para estar disponibles para asuntos urgentes fuera del horario de oficina, no estamos disponibles las 24 horas del da, los 7 das de la semana.   Si tiene un problema urgente y no puede comunicarse con nosotros, puede optar por buscar atencin mdica  en el consultorio de su doctor(a), en una clnica privada, en un centro de atencin urgente o en una sala de emergencias.  Si tiene una emergencia mdica, por favor llame inmediatamente al 911 o vaya a la sala de emergencias.  Nmeros de bper  - Dr. Kowalski: [phone]  - Dra. Moye: [phone]  - Dra. Stewart: [phone]  En caso de inclemencias del tiempo, por favor llame a nuestra lnea principal al [phone] para una actualizacin sobre el estado de cualquier retraso o cierre.  Consejos para la medicacin en dermatologa: Por favor, guarde las cajas en las que vienen los medicamentos de uso tpico para ayudarle a seguir las instrucciones sobre dnde   y cmo usarlos. Las farmacias generalmente imprimen las instrucciones del medicamento slo en las cajas y no directamente en los tubos del medicamento.   Si su medicamento es muy caro, por favor, pngase en contacto con nuestra oficina llamando al [phone] y presione la opcin 4 o envenos un mensaje a travs de MyChart.   No podemos decirle cul ser su copago por los medicamentos por adelantado ya que esto es diferente dependiendo de la cobertura de su seguro. Sin embargo, es posible que podamos encontrar un medicamento sustituto a menor costo o llenar un formulario para que el seguro cubra el medicamento que se considera necesario.   Si se requiere una autorizacin previa para que su compaa de seguros cubra su medicamento, por favor permtanos de 1 a 2 das hbiles para  completar este proceso.  Los precios de los medicamentos varan con frecuencia dependiendo del lugar de dnde se surte la receta y alguna farmacias pueden ofrecer precios ms baratos.  El sitio web www.goodrx.com tiene cupones para medicamentos de diferentes farmacias. Los precios aqu no tienen en cuenta lo que podra costar con la ayuda del seguro (puede ser ms barato con su seguro), pero el sitio web puede darle el precio si no utiliz ningn seguro.  - Puede imprimir el cupn correspondiente y llevarlo con su receta a la farmacia.  - Tambin puede pasar por nuestra oficina durante el horario de atencin regular y recoger una tarjeta de cupones de GoodRx.  - Si necesita que su receta se enve electrnicamente a una farmacia diferente, informe a nuestra oficina a travs de MyChart de [place] o por telfono llamando al [phone] y presione la opcin 4.  

## 2022-11-21 ENCOUNTER — Other Ambulatory Visit: Payer: Self-pay

## 2022-11-22 ENCOUNTER — Other Ambulatory Visit: Payer: Self-pay

## 2022-11-23 ENCOUNTER — Other Ambulatory Visit: Payer: Self-pay

## 2022-11-27 ENCOUNTER — Encounter: Payer: Self-pay | Admitting: Obstetrics and Gynecology

## 2022-11-27 ENCOUNTER — Ambulatory Visit: Payer: Commercial Managed Care - PPO | Admitting: Obstetrics and Gynecology

## 2022-11-27 VITALS — BP 120/76 | Ht 63.0 in | Wt 186.0 lb

## 2022-11-27 DIAGNOSIS — Z79890 Hormone replacement therapy (postmenopausal): Secondary | ICD-10-CM

## 2022-11-27 DIAGNOSIS — N951 Menopausal and female climacteric states: Secondary | ICD-10-CM | POA: Diagnosis not present

## 2022-11-27 NOTE — Progress Notes (Signed)
Lori Arbour, MD   Chief Complaint  Patient presents with   Follow-up    Ended HRT on June 1st and feels miserable. Hot flashes and freezing other times, feeling tired, insomnia, joints ache    HPI:      [name] is a 62 y.o. [id number] whose LMP was No LMP recorded. Patient has had a hysterectomy., presents today for HRT f/u. Pt was on prometrium 100 mg at bedtime 6 days on, 1 day off and estradiol 0.5 mg. Discussed trying to wean down at annual 5/23 but pt weaned off altogether by ~June 1. Now feels miserable with hot flashes, night sweats, fatigue, joint aches, legs feel "like lead". So tired is not motivated to do much; not exercising. Sx worsening. Doesn't remember if sx increased after stopping estrogen or just progesterone. Thinks she did fine with estradiol 0.25 mg dose.  Has annual 9/24  Patient Active Problem List   Diagnosis Date Noted   Vasomotor symptoms due to menopause 04/28/2018    Past Surgical History:  Procedure Laterality Date   COLONOSCOPY  2014   wnl repeat 10 yrs   KYPHOPLASTY N/A 02/09/2021   Procedure: L1 KYPHOPLASTY;  Surgeon: Kennedy Bucker, MD;  Location: [facility] ORS;  Service: Orthopedics;  Laterality: N/A;   OTHER SURGICAL HISTORY  03/2021   Cataract surgery both eyes   SUPRACERVICAL ABDOMINAL HYSTERECTOMY  2011   pelvic pain    Family History  Problem Relation Age of Onset   Hypothyroidism Mother    Heart disease Father    Hyperlipidemia Father    Heart disease Sister    Breast cancer Neg Hx     Social History   Socioeconomic History   Marital status: Married    Spouse name: Alecia Lemming   Number of children: 0   Years of education: Not on file   Highest education level: Not on file  Occupational History   Not on file  Tobacco Use   Smoking status: Never   Smokeless tobacco: Never  Vaping Use   Vaping Use: Never used  Substance and Sexual Activity   Alcohol use: No    Comment: rarely   Drug use: No   Sexual activity:  Not Currently    Partners: Male    Birth control/protection: Surgical    Comment: Hysterectomy  Other Topics Concern   Not on file  Social History Narrative   Not on file   Social Determinants of Health   Financial Resource Strain: Not on file  Food Insecurity: Not on file  Transportation Needs: Not on file  Physical Activity: Not on file  Stress: Not on file  Social Connections: Not on file  Intimate Partner Violence: Not on file    Outpatient Medications Prior to Visit  Medication Sig Dispense Refill   ALPRAZolam (XANAX) 0.25 MG tablet Take 0.25 mg by mouth 2 (two) times daily as needed.     Azelaic Acid (FINACEA) 15 % gel Apply 1 Application topically as directed. Daily to bid to face for Rosacea, After skin is thoroughly washed and patted dry, gently but thoroughly massage a thin film of azelaic acid cream into the affected area twice daily, in the morning and evening. 50 g 11   cetirizine-pseudoephedrine (ZYRTEC-D) 5-120 MG tablet Take 1 tablet by mouth daily.     desonide (DESOWEN) 0.05 % lotion Apply topically 2 (two) times daily. (Patient taking differently: Apply topically 2 (two) times daily as needed.) 59 mL 2  doxycycline (PERIOSTAT) 20 MG tablet Take by mouth.     finasteride (PROSCAR) 5 MG tablet Take 1 tablet (5 mg total) by mouth daily. 90 tablet 1   hydrochlorothiazide (HYDRODIURIL) 25 MG tablet Take 25 mg by mouth daily as needed.     levothyroxine (SYNTHROID) 25 MCG tablet Take 1 tablet (25 mcg total) by mouth daily on an empty stomach with a glass of water at least 30-60 mins before breakfast. 90 tablet 3   minoxidil (LONITEN) 2.5 MG tablet Take 1 tablet (2.5 mg total) by mouth daily. 90 tablet 1   pantoprazole (PROTONIX) 20 MG tablet Take 1 tablet by mouth daily.     rOPINIRole (REQUIP) 0.25 MG tablet Take 3 tablets by mouth daily.     venlafaxine XR (EFFEXOR-XR) 37.5 MG 24 hr capsule Take 1 capsule by mouth daily.     estradiol (ESTRACE) 0.5 MG tablet Take 1  tablet (0.5 mg total) by mouth daily. (Patient not taking: Reported on 11/20/2022) 90 tablet 3   ketoconazole (NIZORAL) 2 % cream Apply to affected areas one - two times daily as needed for rash. 60 g 2   levothyroxine (SYNTHROID, LEVOTHROID) 25 MCG tablet Take 25 mcg by mouth daily.     Multiple Vitamin (MULTIVITAMIN PO) Take by mouth.     Oxymetazoline HCl (RHOFADE) 1 % CREA Apply to affected areas every morning (Patient taking differently: as needed. Apply to affected areas every morning) 30 g 11   pantoprazole (PROTONIX) 20 MG tablet Take 20 mg by mouth daily.     pantoprazole (PROTONIX) 20 MG tablet Take 1 tablet (20 mg total) by mouth daily. 90 tablet 3   progesterone (PROMETRIUM) 100 MG capsule Take 1 cap nightly, 6 nights on, 1 night off (Patient not taking: Reported on 11/20/2022) 90 capsule 3   rOPINIRole (REQUIP) 0.25 MG tablet Take 0.25 mg by mouth 2 (two) times daily.     rOPINIRole (REQUIP) 0.25 MG tablet Take 3 tablets (0.75 mg total) by mouth daily. 270 tablet 3   venlafaxine XR (EFFEXOR-XR) 37.5 MG 24 hr capsule Take 37.5 mg by mouth daily.     venlafaxine XR (EFFEXOR-XR) 37.5 MG 24 hr capsule Take 1 capsule (37.5 mg total) by mouth daily. 90 capsule 1   No facility-administered medications prior to visit.      ROS:  Review of Systems  Constitutional:  Positive for fatigue. Negative for fever.  Gastrointestinal:  Positive for constipation. Negative for blood in stool, diarrhea, nausea and vomiting.  Genitourinary:  Negative for dyspareunia, dysuria, flank pain, frequency, hematuria, urgency, vaginal bleeding, vaginal discharge and vaginal pain.  Musculoskeletal:  Positive for arthralgias. Negative for back pain.  Skin:  Negative for rash.   BREAST: No symptoms   OBJECTIVE:   Vitals:  BP 120/76   Ht 5\' 3"  (1.6 m)   Wt 186 lb (84.4 kg)   BMI 32.95 kg/m   Physical Exam Vitals reviewed.  Constitutional:      Appearance: She is well-developed.  Pulmonary:      Effort: Pulmonary effort is normal.  Musculoskeletal:        General: Normal range of motion.     Cervical back: Normal range of motion.  Skin:    General: Skin is warm and dry.  Neurological:     General: No focal deficit present.     Mental Status: She is alert and oriented to person, place, and time.     Cranial Nerves: No cranial nerve deficit.  Psychiatric:        Mood and Affect: Mood normal.        Behavior: Behavior normal.        Thought Content: Thought content normal.        Judgment: Judgment normal.    Assessment/Plan: Vasomotor symptoms due to menopause--sx terrible off all HRT. Discussed pros/cons of HRT. Will resume prometrium for a month and then f/u with me re: sx. If still sx, will restart 0.25 mg estradiol. Pt has Rxs left.   Hormone replacement therapy (HRT)    Return if symptoms worsen or fail to improve.  Dail Lerew B. Zeyad Delaguila, PA-C 11/27/2022 10:09 AM

## 2022-12-09 ENCOUNTER — Encounter: Payer: Self-pay | Admitting: Dermatology

## 2022-12-10 ENCOUNTER — Other Ambulatory Visit: Payer: Self-pay

## 2022-12-10 MED ORDER — DOXYCYCLINE HYCLATE 20 MG PO TABS
20.0000 mg | ORAL_TABLET | Freq: Two times a day (BID) | ORAL | 6 refills | Status: DC
  Filled 2022-12-10: qty 60, 30d supply, fill #0
  Filled 2022-12-30 – 2023-01-08 (×2): qty 60, 30d supply, fill #1
  Filled 2023-02-10: qty 60, 30d supply, fill #2
  Filled 2023-03-18: qty 60, 30d supply, fill #3
  Filled 2023-04-15: qty 60, 30d supply, fill #4
  Filled 2023-04-28 – 2023-05-19 (×2): qty 60, 30d supply, fill #5
  Filled 2023-06-14: qty 60, 30d supply, fill #6

## 2022-12-10 NOTE — Progress Notes (Signed)
Doxycycline to [facility] community [facility]/sh

## 2022-12-31 ENCOUNTER — Telehealth: Payer: Self-pay | Admitting: Obstetrics and Gynecology

## 2022-12-31 ENCOUNTER — Other Ambulatory Visit (HOSPITAL_COMMUNITY): Payer: Self-pay

## 2022-12-31 NOTE — Telephone Encounter (Signed)
I contacted the patient via phone due to scheduling adjustment with Helmut Muster Copland for Wednesday, 01/23/12 at 8:15 am.I left voicemail for the patient to contact our office for rescheduling. I have rescheduled to the next available appointment for Friday, 9/6 at 8:15 am with [facility]. Awaiting confirmation.

## 2023-01-02 ENCOUNTER — Other Ambulatory Visit: Payer: Self-pay

## 2023-01-03 ENCOUNTER — Other Ambulatory Visit (HOSPITAL_COMMUNITY): Payer: Self-pay

## 2023-01-03 ENCOUNTER — Other Ambulatory Visit: Payer: Self-pay

## 2023-01-08 ENCOUNTER — Other Ambulatory Visit: Payer: Self-pay

## 2023-01-08 ENCOUNTER — Other Ambulatory Visit (HOSPITAL_COMMUNITY): Payer: Self-pay

## 2023-01-09 ENCOUNTER — Other Ambulatory Visit: Payer: Self-pay

## 2023-01-13 ENCOUNTER — Other Ambulatory Visit (HOSPITAL_COMMUNITY): Payer: Self-pay

## 2023-01-23 ENCOUNTER — Ambulatory Visit: Payer: Managed Care, Other (non HMO) | Admitting: Obstetrics and Gynecology

## 2023-01-24 NOTE — Progress Notes (Signed)
PCP: Lori Arbour, MD   Chief Complaint  Patient presents with   Gynecologic Exam    No concerns    HPI:      [name] is a 62 y.o. No obstetric history on file. who LMP was No LMP recorded. Patient has had a hysterectomy., presents today for her annual examination.  Her menses are absent due to menopause/lap supracx hyst due to leio and pelvic pain. She does not have PMB.  Weaned off estradiol and prometrium last yr for VS sx, but pt resumed terrible vasomotor sx, arthralgias, fatigue off HRT, so restarted prometrium first 7/24. Pt doing much better and wants to cont. Doesn't feel like she needs ERT Rx again.   Sex activity: not sexually active; contraception - post menopausal status. No vag sx.   Last Pap: 09/07/20  Results were: no abnormalities /neg HPV DNA.  Hx of STDs: none  Last mammogram: 03/06/22  Results were: normal. Repeat in 12 months.  There is no FH of breast cancer. There is no FH of ovarian cancer. The patient does not do self-breast exams.  Colonoscopy: colonoscopy 2014 with Dr. Marva Panda without abnormalities.  Repeat due after 10 years.  DEXA 3/23 with PCP after fx; osteopenia in hip (no longer on ERT)  Tobacco use: The patient denies current or previous tobacco use. Alcohol use: none  No drug use Exercise: min active  She does get adequate calcium but not enough Vitamin D in her diet.  Labs with PCP.   Past Medical History:  Diagnosis Date   Actinic keratosis    Anxiety    DDD (degenerative disc disease), lumbar    GERD (gastroesophageal reflux disease)    History of kidney stones    Hypothyroidism    Osteopenia    Restless leg syndrome    Sleep apnea    uses CPAP    Past Surgical History:  Procedure Laterality Date   COLONOSCOPY  2014   wnl repeat 10 yrs   KYPHOPLASTY N/A 02/09/2021   Procedure: L1 KYPHOPLASTY;  Surgeon: Kennedy Bucker, MD;  Location: [facility] ORS;  Service: Orthopedics;  Laterality: N/A;   OTHER SURGICAL  HISTORY  03/2021   Cataract surgery both eyes   SUPRACERVICAL ABDOMINAL HYSTERECTOMY  2011   pelvic pain    Family History  Problem Relation Age of Onset   Hypothyroidism Mother    Heart disease Father    Hyperlipidemia Father    Heart disease Sister    Breast cancer Neg Hx     Social History   Socioeconomic History   Marital status: Married    Spouse name: Alecia Lemming   Number of children: 0   Years of education: Not on file   Highest education level: Not on file  Occupational History   Not on file  Tobacco Use   Smoking status: Never   Smokeless tobacco: Never  Vaping Use   Vaping status: Never Used  Substance and Sexual Activity   Alcohol use: Yes    Comment: soc   Drug use: No   Sexual activity: Not Currently    Partners: Male    Birth control/protection: Surgical    Comment: Hysterectomy  Other Topics Concern   Not on file  Social History Narrative   Not on file   Social Determinants of Health   Financial Resource Strain: Not on file  Food Insecurity: Not on file  Transportation Needs: Not on file  Physical Activity: Not on file  Stress: Not  on file  Social Connections: Not on file  Intimate Partner Violence: Not on file    Current Meds  Medication Sig   ALPRAZolam (XANAX) 0.25 MG tablet Take 0.25 mg by mouth 2 (two) times daily as needed.   Azelaic Acid (FINACEA) 15 % gel Apply 1 Application topically as directed. Daily to bid to face for Rosacea, After skin is thoroughly washed and patted dry, gently but thoroughly massage a thin film of azelaic acid cream into the affected area twice daily, in the morning and evening.   cetirizine-pseudoephedrine (ZYRTEC-D) 5-120 MG tablet Take 1 tablet by mouth daily.   desonide (DESOWEN) 0.05 % lotion Apply topically 2 (two) times daily. (Patient taking differently: Apply topically 2 (two) times daily as needed.)   doxycycline (PERIOSTAT) 20 MG tablet Take 1 tablet (20 mg total) by mouth 2 (two) times daily. Take with  food and drink   finasteride (PROSCAR) 5 MG tablet Take 1 tablet (5 mg total) by mouth daily.   hydrochlorothiazide (HYDRODIURIL) 25 MG tablet Take 25 mg by mouth daily as needed.   levothyroxine (SYNTHROID) 25 MCG tablet Take 1 tablet (25 mcg total) by mouth daily on an empty stomach with a glass of water at least 30-60 mins before breakfast.   minoxidil (LONITEN) 2.5 MG tablet Take 1 tablet (2.5 mg total) by mouth daily.   pantoprazole (PROTONIX) 20 MG tablet Take 1 tablet by mouth daily.   rOPINIRole (REQUIP) 0.25 MG tablet Take 3 tablets by mouth daily.   venlafaxine XR (EFFEXOR-XR) 37.5 MG 24 hr capsule Take 1 capsule by mouth daily.      ROS:  Review of Systems  Constitutional:  Positive for fatigue. Negative for fever and unexpected weight change.  Respiratory:  Negative for cough, shortness of breath and wheezing.   Cardiovascular:  Negative for chest pain, palpitations and leg swelling.  Gastrointestinal:  Negative for blood in stool, constipation, diarrhea, nausea and vomiting.  Endocrine: Negative for cold intolerance, heat intolerance and polyuria.  Genitourinary:  Negative for dyspareunia, dysuria, flank pain, frequency, genital sores, hematuria, menstrual problem, pelvic pain, urgency, vaginal bleeding, vaginal discharge and vaginal pain.  Musculoskeletal:  Negative for back pain, joint swelling and myalgias.  Skin:  Negative for rash.  Neurological:  Negative for dizziness, syncope, light-headedness, numbness and headaches.  Hematological:  Negative for adenopathy.  Psychiatric/Behavioral:  Negative for agitation, confusion, sleep disturbance and suicidal ideas. The patient is not nervous/anxious.      Objective: BP 112/73   Pulse 99   Ht 5\' 4"  (1.626 m)   Wt 186 lb (84.4 kg)   BMI 31.93 kg/m    Physical Exam Constitutional:      Appearance: She is well-developed.  Genitourinary:     Vulva normal.     Genitourinary Comments: UTERUS/CX SURG REM     Right  Labia: No rash, tenderness or lesions.    Left Labia: No tenderness, lesions or rash.    Vaginal cuff intact.    No vaginal discharge, erythema or tenderness.      Right Adnexa: not tender and no mass present.    Left Adnexa: not tender and no mass present.    Cervix is absent.     Uterus is absent.  Breasts:    Right: No mass, nipple discharge, skin change or tenderness.     Left: No mass, nipple discharge, skin change or tenderness.  Neck:     Thyroid: No thyromegaly.  Cardiovascular:     Rate and  Rhythm: Normal rate and regular rhythm.     Heart sounds: Normal heart sounds. No murmur heard. Pulmonary:     Effort: Pulmonary effort is normal.     Breath sounds: Normal breath sounds.  Abdominal:     Palpations: Abdomen is soft.     Tenderness: There is no abdominal tenderness. There is no guarding.  Musculoskeletal:        General: Normal range of motion.     Cervical back: Normal range of motion.  Neurological:     General: No focal deficit present.     Mental Status: She is alert and oriented to person, place, and time.     Cranial Nerves: No cranial nerve deficit.  Skin:    General: Skin is warm and dry.  Psychiatric:        Mood and Affect: Mood normal.        Behavior: Behavior normal.        Thought Content: Thought content normal.        Judgment: Judgment normal.  Vitals reviewed.     Assessment/Plan: Encounter for annual routine gynecological examination  Encounter for screening mammogram for malignant neoplasm of breast - Plan: MM 3D SCREENING MAMMOGRAM BILATERAL BREAST; pt to schedule mammo  Screening for colon cancer - Plan: Ambulatory referral to Gastroenterology; refer to [facility] since seen there 2014  Hormone replacement therapy (HRT) - Plan: progesterone (PROMETRIUM) 100 MG capsule; Rx RF eRxd. F/u prn.   Vasomotor symptoms due to menopause - Plan: progesterone (PROMETRIUM) 100 MG capsule   Meds ordered this encounter  Medications   progesterone  (PROMETRIUM) 100 MG capsule    Sig: Take 1 cap nightly, 6 nights on, 1 night off    Dispense:  90 capsule    Refill:  3    Order Specific Question:   Supervising Provider    Answer:   Waymon Budge           GYN counsel breast self exam, mammography screening, menopause, adequate intake of calcium and vitamin D, diet and exercise    F/U  Return in about 1 year (around 01/25/2024).  Xaviar Lunn B. Corvette Orser, PA-C 01/25/2023 10:34 AM

## 2023-01-25 ENCOUNTER — Ambulatory Visit (INDEPENDENT_AMBULATORY_CARE_PROVIDER_SITE_OTHER): Payer: Commercial Managed Care - PPO | Admitting: Obstetrics and Gynecology

## 2023-01-25 ENCOUNTER — Encounter: Payer: Self-pay | Admitting: Obstetrics and Gynecology

## 2023-01-25 ENCOUNTER — Other Ambulatory Visit: Payer: Self-pay

## 2023-01-25 VITALS — BP 112/73 | HR 99 | Ht 64.0 in | Wt 186.0 lb

## 2023-01-25 DIAGNOSIS — Z79890 Hormone replacement therapy (postmenopausal): Secondary | ICD-10-CM

## 2023-01-25 DIAGNOSIS — Z1231 Encounter for screening mammogram for malignant neoplasm of breast: Secondary | ICD-10-CM

## 2023-01-25 DIAGNOSIS — Z01419 Encounter for gynecological examination (general) (routine) without abnormal findings: Secondary | ICD-10-CM

## 2023-01-25 DIAGNOSIS — N951 Menopausal and female climacteric states: Secondary | ICD-10-CM

## 2023-01-25 DIAGNOSIS — Z1211 Encounter for screening for malignant neoplasm of colon: Secondary | ICD-10-CM

## 2023-01-25 MED ORDER — PROGESTERONE MICRONIZED 100 MG PO CAPS
100.0000 mg | ORAL_CAPSULE | Freq: Every evening | ORAL | 3 refills | Status: AC
  Filled 2023-01-25: qty 78, 84d supply, fill #0
  Filled 2023-06-17 – 2023-07-14 (×2): qty 78, 84d supply, fill #1

## 2023-01-25 NOTE — Telephone Encounter (Signed)
Nevermind Gracie. I notified pt. Thx.

## 2023-01-25 NOTE — Patient Instructions (Addendum)
I value your feedback and you entrusting us with your care. If you get a [facility] patient survey, I would appreciate you taking the time to let us know about your experience today. Thank you!  [facility] ([place])--[phone]  

## 2023-02-11 ENCOUNTER — Other Ambulatory Visit: Payer: Self-pay

## 2023-03-01 ENCOUNTER — Other Ambulatory Visit: Payer: Self-pay

## 2023-03-04 ENCOUNTER — Other Ambulatory Visit: Payer: Self-pay

## 2023-03-04 DIAGNOSIS — Z1211 Encounter for screening for malignant neoplasm of colon: Secondary | ICD-10-CM | POA: Diagnosis not present

## 2023-03-04 DIAGNOSIS — Z0000 Encounter for general adult medical examination without abnormal findings: Secondary | ICD-10-CM | POA: Diagnosis not present

## 2023-03-04 DIAGNOSIS — Z79899 Other long term (current) drug therapy: Secondary | ICD-10-CM | POA: Diagnosis not present

## 2023-03-04 DIAGNOSIS — Z1231 Encounter for screening mammogram for malignant neoplasm of breast: Secondary | ICD-10-CM | POA: Diagnosis not present

## 2023-03-04 DIAGNOSIS — E7800 Pure hypercholesterolemia, unspecified: Secondary | ICD-10-CM | POA: Diagnosis not present

## 2023-03-04 DIAGNOSIS — M810 Age-related osteoporosis without current pathological fracture: Secondary | ICD-10-CM | POA: Diagnosis not present

## 2023-03-04 DIAGNOSIS — G4733 Obstructive sleep apnea (adult) (pediatric): Secondary | ICD-10-CM | POA: Diagnosis not present

## 2023-03-04 DIAGNOSIS — F419 Anxiety disorder, unspecified: Secondary | ICD-10-CM | POA: Diagnosis not present

## 2023-03-04 DIAGNOSIS — E039 Hypothyroidism, unspecified: Secondary | ICD-10-CM | POA: Diagnosis not present

## 2023-03-04 DIAGNOSIS — R5381 Other malaise: Secondary | ICD-10-CM | POA: Diagnosis not present

## 2023-03-04 MED ORDER — PANTOPRAZOLE SODIUM 40 MG PO TBEC
40.0000 mg | DELAYED_RELEASE_TABLET | Freq: Every day | ORAL | 3 refills | Status: AC
  Filled 2023-03-04: qty 90, 90d supply, fill #0
  Filled 2023-04-15 – 2023-09-20 (×4): qty 90, 90d supply, fill #1

## 2023-03-05 DIAGNOSIS — E7800 Pure hypercholesterolemia, unspecified: Secondary | ICD-10-CM | POA: Diagnosis not present

## 2023-03-05 DIAGNOSIS — R5383 Other fatigue: Secondary | ICD-10-CM | POA: Diagnosis not present

## 2023-03-05 DIAGNOSIS — M810 Age-related osteoporosis without current pathological fracture: Secondary | ICD-10-CM | POA: Diagnosis not present

## 2023-03-05 DIAGNOSIS — Z79899 Other long term (current) drug therapy: Secondary | ICD-10-CM | POA: Diagnosis not present

## 2023-03-05 DIAGNOSIS — R5381 Other malaise: Secondary | ICD-10-CM | POA: Diagnosis not present

## 2023-03-10 ENCOUNTER — Other Ambulatory Visit: Payer: Self-pay

## 2023-03-10 MED ORDER — FINASTERIDE 5 MG PO TABS
5.0000 mg | ORAL_TABLET | Freq: Every day | ORAL | 3 refills | Status: DC
  Filled 2023-03-10 – 2023-09-25 (×3): qty 90, 90d supply, fill #0

## 2023-03-10 MED ORDER — PROGESTERONE MICRONIZED 100 MG PO CAPS
100.0000 mg | ORAL_CAPSULE | Freq: Every day | ORAL | 1 refills | Status: DC
  Filled 2023-03-10 – 2023-04-28 (×2): qty 90, 90d supply, fill #0
  Filled 2023-06-17 – 2023-09-25 (×5): qty 90, 90d supply, fill #1

## 2023-03-10 MED ORDER — HYDROCHLOROTHIAZIDE 25 MG PO TABS
25.0000 mg | ORAL_TABLET | Freq: Every day | ORAL | 3 refills | Status: AC
  Filled 2023-03-10: qty 90, 90d supply, fill #0

## 2023-03-10 MED ORDER — VENLAFAXINE HCL ER 37.5 MG PO CP24
37.5000 mg | ORAL_CAPSULE | Freq: Every day | ORAL | 1 refills | Status: DC
  Filled 2023-03-10: qty 90, 90d supply, fill #0
  Filled 2023-06-17: qty 90, 90d supply, fill #1

## 2023-03-10 MED ORDER — ROPINIROLE HCL 0.25 MG PO TABS
0.7500 mg | ORAL_TABLET | Freq: Every day | ORAL | 3 refills | Status: AC
  Filled 2023-03-10: qty 270, 90d supply, fill #0
  Filled 2023-04-28 – 2023-06-17 (×2): qty 270, 90d supply, fill #1
  Filled 2023-09-20: qty 270, 90d supply, fill #2

## 2023-03-10 MED ORDER — ALPRAZOLAM 0.25 MG PO TABS
0.2500 mg | ORAL_TABLET | Freq: Two times a day (BID) | ORAL | 1 refills | Status: AC | PRN
  Filled 2023-03-10: qty 90, 45d supply, fill #0

## 2023-03-10 MED ORDER — MINOXIDIL 2.5 MG PO TABS
2.5000 mg | ORAL_TABLET | Freq: Every day | ORAL | 3 refills | Status: DC
  Filled 2023-03-10 – 2023-09-25 (×3): qty 90, 90d supply, fill #0

## 2023-03-10 MED ORDER — LEVOTHYROXINE SODIUM 25 MCG PO TABS
25.0000 ug | ORAL_TABLET | Freq: Every day | ORAL | 3 refills | Status: DC
  Filled 2023-03-10 – 2023-04-15 (×2): qty 90, 90d supply, fill #0
  Filled 2023-04-28 – 2024-01-07 (×8): qty 90, 90d supply, fill #1

## 2023-03-10 MED ORDER — PANTOPRAZOLE SODIUM 40 MG PO TBEC
40.0000 mg | DELAYED_RELEASE_TABLET | Freq: Every day | ORAL | 3 refills | Status: AC
  Filled 2023-04-28: qty 90, fill #0
  Filled 2023-05-29: qty 90, 90d supply, fill #0
  Filled 2023-06-17 – 2023-08-25 (×2): qty 90, 90d supply, fill #1
  Filled 2023-09-20 – 2023-11-30 (×2): qty 90, 90d supply, fill #2

## 2023-03-11 ENCOUNTER — Other Ambulatory Visit: Payer: Self-pay

## 2023-03-14 ENCOUNTER — Ambulatory Visit
Admission: RE | Admit: 2023-03-14 | Discharge: 2023-03-14 | Disposition: A | Discharge status: Home / Self Care | Payer: Commercial Managed Care - PPO | Source: Ambulatory Visit | Attending: Obstetrics and Gynecology | Admitting: Obstetrics and Gynecology

## 2023-03-14 DIAGNOSIS — Z1231 Encounter for screening mammogram for malignant neoplasm of breast: Secondary | ICD-10-CM | POA: Insufficient documentation

## 2023-03-18 ENCOUNTER — Other Ambulatory Visit (HOSPITAL_COMMUNITY): Payer: Self-pay

## 2023-04-15 ENCOUNTER — Other Ambulatory Visit (HOSPITAL_COMMUNITY): Payer: Self-pay

## 2023-04-16 ENCOUNTER — Other Ambulatory Visit: Payer: Self-pay

## 2023-04-29 ENCOUNTER — Other Ambulatory Visit: Payer: Self-pay

## 2023-04-29 ENCOUNTER — Other Ambulatory Visit (HOSPITAL_COMMUNITY): Payer: Self-pay

## 2023-05-06 ENCOUNTER — Other Ambulatory Visit: Payer: Self-pay

## 2023-05-06 MED ORDER — BENZONATATE 200 MG PO CAPS
200.0000 mg | ORAL_CAPSULE | Freq: Three times a day (TID) | ORAL | 0 refills | Status: AC
Start: 2023-05-06 — End: ?
  Filled 2023-05-06: qty 30, 10d supply, fill #0

## 2023-05-06 MED ORDER — AMOXICILLIN-POT CLAVULANATE 875-125 MG PO TABS
1.0000 | ORAL_TABLET | Freq: Two times a day (BID) | ORAL | 0 refills | Status: DC
  Filled 2023-05-06: qty 20, 10d supply, fill #0

## 2023-05-06 MED ORDER — AZELASTINE HCL 0.1 % NA SOLN
2.0000 | Freq: Two times a day (BID) | NASAL | 0 refills | Status: AC
Start: 2023-05-06 — End: ?
  Filled 2023-05-06: qty 30, 30d supply, fill #0

## 2023-05-20 ENCOUNTER — Other Ambulatory Visit: Payer: Self-pay

## 2023-05-29 ENCOUNTER — Other Ambulatory Visit (HOSPITAL_COMMUNITY): Payer: Self-pay

## 2023-05-29 ENCOUNTER — Other Ambulatory Visit: Payer: Self-pay

## 2023-06-05 ENCOUNTER — Ambulatory Visit: Payer: Commercial Managed Care - PPO | Admitting: Dermatology

## 2023-06-05 DIAGNOSIS — W908XXA Exposure to other nonionizing radiation, initial encounter: Secondary | ICD-10-CM | POA: Diagnosis not present

## 2023-06-05 DIAGNOSIS — D492 Neoplasm of unspecified behavior of bone, soft tissue, and skin: Secondary | ICD-10-CM

## 2023-06-05 DIAGNOSIS — L570 Actinic keratosis: Secondary | ICD-10-CM

## 2023-06-05 DIAGNOSIS — D489 Neoplasm of uncertain behavior, unspecified: Secondary | ICD-10-CM

## 2023-06-05 DIAGNOSIS — L820 Inflamed seborrheic keratosis: Secondary | ICD-10-CM

## 2023-06-05 DIAGNOSIS — L578 Other skin changes due to chronic exposure to nonionizing radiation: Secondary | ICD-10-CM | POA: Diagnosis not present

## 2023-06-05 DIAGNOSIS — B079 Viral wart, unspecified: Secondary | ICD-10-CM

## 2023-06-05 NOTE — Patient Instructions (Signed)
 Electrodesiccation and Curettage ("Scrape and Burn") Wound Care Instructions  Leave the original bandage on for 24 hours if possible.  If the bandage becomes soaked or soiled before that time, it is OK to remove it and examine the wound.  A small amount of post-operative bleeding is normal.  If excessive bleeding occurs, remove the bandage, place gauze over the site and apply continuous pressure (no peeking) over the area for 30 minutes. If this does not work, please call our clinic as soon as possible or page your doctor if it is after hours.   Once a day, cleanse the wound with soap and water. It is fine to shower. If a thick crust develops you may use a Q-tip dipped into dilute hydrogen peroxide (mix 1:1 with water) to dissolve it.  Hydrogen peroxide can slow the healing process, so use it only as needed.    After washing, apply petroleum jelly (Vaseline) or an antibiotic ointment if your doctor prescribed one for you, followed by a bandage.    For best healing, the wound should be covered with a layer of ointment at all times. If you are not able to keep the area covered with a bandage to hold the ointment in place, this may mean re-applying the ointment several times a day.  Continue this wound care until the wound has healed and is no longer open. It may take several weeks for the wound to heal and close.  Itching and mild discomfort is normal during the healing process.  If you have any discomfort, you can take Tylenol (acetaminophen) or ibuprofen as directed on the bottle. (Please do not take these if you have an allergy to them or cannot take them for another reason).  Some redness, tenderness and white or yellow material in the wound is normal healing.  If the area becomes very sore and red, or develops a thick yellow-green material (pus), it may be infected; please notify us.    Wound healing continues for up to one year following surgery. It is not unusual to experience pain in the scar  from time to time during the interval.  If the pain becomes severe or the scar thickens, you should notify the office.    A slight amount of redness in a scar is expected for the first six months.  After six months, the redness will fade and the scar will soften and fade.  The color difference becomes less noticeable with time.  If there are any problems, return for a post-op surgery check at your earliest convenience.  To improve the appearance of the scar, you can use silicone scar gel, cream, or sheets (such as Mederma or Serica) every night for up to one year. These are available over the counter (without a prescription).  Please call our office at [phone] for any questions or concerns.   Biopsy Wound Care Instructions  Leave the original bandage on for 24 hours if possible.  If the bandage becomes soaked or soiled before that time, it is OK to remove it and examine the wound.  A small amount of post-operative bleeding is normal.  If excessive bleeding occurs, remove the bandage, place gauze over the site and apply continuous pressure (no peeking) over the area for 30 minutes. If this does not work, please call our clinic as soon as possible or page your doctor if it is after hours.   Once a day, cleanse the wound with soap and water. It is fine to shower.  If a thick crust develops you may use a Q-tip dipped into dilute hydrogen peroxide (mix 1:1 with water) to dissolve it.  Hydrogen peroxide can slow the healing process, so use it only as needed.    After washing, apply petroleum jelly (Vaseline) or an antibiotic ointment if your doctor prescribed one for you, followed by a bandage.    For best healing, the wound should be covered with a layer of ointment at all times. If you are not able to keep the area covered with a bandage to hold the ointment in place, this may mean re-applying the ointment several times a day.  Continue this wound care until the wound has healed and is no longer  open.   Itching and mild discomfort is normal during the healing process. However, if you develop pain or severe itching, please call our office.   If you have any discomfort, you can take Tylenol (acetaminophen) or ibuprofen as directed on the bottle. (Please do not take these if you have an allergy to them or cannot take them for another reason).  Some redness, tenderness and white or yellow material in the wound is normal healing.  If the area becomes very sore and red, or develops a thick yellow-green material (pus), it may be infected; please notify us.    If you have stitches, return to clinic as directed to have the stitches removed. You will continue wound care for 2-3 days after the stitches are removed.   Wound healing continues for up to one year following surgery. It is not unusual to experience pain in the scar from time to time during the interval.  If the pain becomes severe or the scar thickens, you should notify the office.    A slight amount of redness in a scar is expected for the first six months.  After six months, the redness will fade and the scar will soften and fade.  The color difference becomes less noticeable with time.  If there are any problems, return for a post-op surgery check at your earliest convenience.  To improve the appearance of the scar, you can use silicone scar gel, cream, or sheets (such as Mederma or Serica) every night for up to one year. These are available over the counter (without a prescription).  Please call our office at [phone] for any questions or concerns.        Actinic keratoses are precancerous spots that appear secondary to cumulative UV radiation exposure/sun exposure over time. They are chronic with expected duration over 1 year. A portion of actinic keratoses will progress to squamous cell carcinoma of the skin. It is not possible to reliably predict which spots will progress to skin cancer and so treatment is recommended to  prevent development of skin cancer.  Recommend daily broad spectrum sunscreen SPF 30+ to sun-exposed areas, reapply every 2 hours as needed.  Recommend staying in the shade or wearing long sleeves, sun glasses (UVA+UVB protection) and wide brim hats (4-inch brim around the entire circumference of the hat). Call for new or changing lesions.    Cryotherapy Aftercare  Wash gently with soap and water everyday.   Apply Vaseline and Band-Aid daily until healed.   Seborrheic Keratosis  What causes seborrheic keratoses? Seborrheic keratoses are harmless, common skin growths that first appear during adult life.  As time goes by, more growths appear.  Some people may develop a large number of them.  Seborrheic keratoses appear on both covered and uncovered body  parts.  They are not caused by sunlight.  The tendency to develop seborrheic keratoses can be inherited.  They vary in color from skin-colored to gray, brown, or even black.  They can be either smooth or have a rough, warty surface.   Seborrheic keratoses are superficial and look as if they were stuck on the skin.  Under the microscope this type of keratosis looks like layers upon layers of skin.  That is why at times the top layer may seem to fall off, but the rest of the growth remains and re-grows.    Treatment Seborrheic keratoses do not need to be treated, but can easily be removed in the office.  Seborrheic keratoses often cause symptoms when they rub on clothing or jewelry.  Lesions can be in the way of shaving.  If they become inflamed, they can cause itching, soreness, or burning.  Removal of a seborrheic keratosis can be accomplished by freezing, burning, or surgery. If any spot bleeds, scabs, or grows rapidly, please return to have it checked, as these can be an indication of a skin cancer.    Due to recent changes in healthcare laws, you may see results of your pathology and/or laboratory studies on MyChart before the doctors have  had a chance to review them. We understand that in some cases there may be results that are confusing or concerning to you. Please understand that not all results are received at the same time and often the doctors may need to interpret multiple results in order to provide you with the best plan of care or course of treatment. Therefore, we ask that you please give [place] 2 business days to thoroughly review all your results before contacting the office for clarification. Should we see a critical lab result, you will be contacted sooner.   If You Need Anything After Your Visit  If you have any questions or concerns for your doctor, please call our main line at [phone] and press option 4 to reach your doctor's medical assistant. If no one answers, please leave a voicemail as directed and we will return your call as soon as possible. Messages left after 4 pm will be answered the following business day.   You may also send [place] a message via MyChart. We typically respond to MyChart messages within 1-2 business days.  For prescription refills, please ask your pharmacy to contact our office. Our fax number is [phone].  If you have an urgent issue when the clinic is closed that cannot wait until the next business day, you can page your doctor at the number below.    Please note that while we do our best to be available for urgent issues outside of office hours, we are not available 24/7.   If you have an urgent issue and are unable to reach [place], you may choose to seek medical care at your doctor's office, retail clinic, urgent care center, or emergency room.  If you have a medical emergency, please immediately call 911 or go to the emergency department.  Pager Numbers  - Dr. Gwen Pounds: [phone]  - Dr. Roseanne Reno: [phone]  - Dr. Katrinka Blazing: [phone]   In the event of inclement weather, please call our main line at [phone] for an update on the status of any delays or  closures.  Dermatology Medication Tips: Please keep the boxes that topical medications come in in order to help keep track of the instructions about where and how to use these. Pharmacies typically print  the medication instructions only on the boxes and not directly on the medication tubes.   If your medication is too expensive, please contact our office at [phone] option 4 or send [place] a message through MyChart.   We are unable to tell what your co-pay for medications will be in advance as this is different depending on your insurance coverage. However, we may be able to find a substitute medication at lower cost or fill out paperwork to get insurance to cover a needed medication.   If a prior authorization is required to get your medication covered by your insurance company, please allow [place] 1-2 business days to complete this process.  Drug prices often vary depending on where the prescription is filled and some pharmacies may offer cheaper prices.  The website www.goodrx.com contains coupons for medications through different pharmacies. The prices here do not account for what the cost may be with help from insurance (it may be cheaper with your insurance), but the website can give you the price if you did not use any insurance.  - You can print the associated coupon and take it with your prescription to the pharmacy.  - You may also stop by our office during regular business hours and pick up a GoodRx coupon card.  - If you need your prescription sent electronically to a different pharmacy, notify our office through [facility] or by phone at [phone] option 4.     Si Usted Necesita Algo Despus de Su Visita  Tambin puede enviarnos un mensaje a travs de Clinical cytogeneticist. Por lo general respondemos a los mensajes de MyChart en el transcurso de 1 a 2 das hbiles.  Para renovar recetas, por favor pida a su farmacia que se ponga en contacto con nuestra oficina. Annie Sable de fax  es [place] [phone].  Si tiene un asunto urgente cuando la clnica est cerrada y que no puede esperar hasta el siguiente da hbil, puede llamar/localizar a su doctor(a) al nmero que aparece a continuacin.   Por favor, tenga en cuenta que aunque hacemos todo lo posible para estar disponibles para asuntos urgentes fuera del horario de [place], no estamos disponibles las 24 horas del da, los 7 [address]  [address] de la [place].   Si tiene un problema urgente y no puede comunicarse con nosotros, puede optar por buscar atencin mdica  en el consultorio de su doctor(a), en una clnica privada, en un centro de atencin urgente o en una sala de emergencias.  Si tiene Engineer, drilling, por favor llame inmediatamente al 911 o vaya a la sala de emergencias.  Nmeros de bper  - Dr. Gwen Pounds: [phone]  - Dra. Roseanne Reno: [phone]  - Dr. Katrinka Blazing: [phone]   En caso de inclemencias del tiempo, por favor llame a Lacy Duverney principal al [phone] para una actualizacin sobre el [place] de cualquier retraso o cierre.  Consejos para la medicacin en dermatologa: Por favor, guarde las cajas en las que vienen los medicamentos de uso tpico para ayudarle a seguir las instrucciones sobre dnde y cmo usarlos. Las farmacias generalmente imprimen las instrucciones del medicamento slo en las cajas y no directamente en los tubos del [place].   Si su medicamento es muy caro, por favor, pngase en contacto con Rolm Gala llamando al [phone] y presione la opcin 4 o envenos un mensaje a travs de Clinical cytogeneticist.   No podemos decirle cul ser su copago por los medicamentos por adelantado ya que esto es diferente dependiendo de la cobertura de su seguro.  Sin embargo, es posible que podamos encontrar un medicamento sustituto a Audiological scientist un formulario para que el seguro cubra el medicamento que se considera necesario.   Si se requiere una autorizacin previa para que su compaa de seguros  [place] su medicamento, por favor permtanos de 1 a 2 das hbiles para completar [address]  Los precios de los medicamentos varan con frecuencia dependiendo del Environmental consultant de dnde se surte la receta y alguna farmacias pueden ofrecer precios ms baratos.  El sitio web www.goodrx.com tiene cupones para medicamentos de Health and safety inspector. Los precios aqu no tienen en cuenta lo que podra costar con la ayuda del seguro (puede ser ms barato con su seguro), pero el sitio web puede darle el precio si no utiliz Tourist information centre manager.  - Puede imprimir el cupn correspondiente y llevarlo con su receta a la farmacia.  - Tambin puede pasar por nuestra oficina durante el horario de atencin regular y Education officer, museum una tarjeta de cupones de GoodRx.  - Si necesita que su receta se enve electrnicamente a una farmacia diferente, informe a nuestra oficina a travs de MyChart de [place] o por telfono llamando al [phone] y presione la opcin 4.

## 2023-06-05 NOTE — Progress Notes (Signed)
Follow-Up Visit   Subjective  Lori Morris is a 63 y.o. female who presents for the following:  Patient here concerning a spot she noticed at her left thigh after her last appointment. She reports spot was treated with Ln2 but did not go away. Has been tender and bleeds.  Patient also reports a spot at nose she noticed 6 months ago that is peeling.  Also noticed a growing rough spot at right breast that gets irritated by her bra.   The patient has spots, moles and lesions to be evaluated, some may be new or changing and the patient may have concern these could be cancer.   The following portions of the chart were reviewed this encounter and updated as appropriate: medications, allergies, medical history  Review of Systems:  No other skin or systemic complaints except as noted in HPI or Assessment and Plan.  Objective  Well appearing patient in no apparent distress; mood and affect are within normal limits.   A focused examination was performed of the following areas: Left thigh, right breast, face , nose  Relevant exam findings are noted in the Assessment and Plan.  Left Thigh - Anterior 0.8 cm verrucous pink papule, previously treated with cryotherapy  right breast x 1 Erythematous stuck-on, waxy papule or plaque nasal dorsum x 1 Erythematous thin papules/macules with gritty scale.   Assessment & Plan     NEOPLASM OF UNCERTAIN BEHAVIOR Left Thigh - Anterior Epidermal / dermal shaving  Lesion diameter (cm):  0.8 Informed consent: discussed and consent obtained   Patient was prepped and draped in usual sterile fashion: Area prepped with alcohol. Anesthesia: the lesion was anesthetized in a standard fashion   Anesthetic:  1% lidocaine w/ epinephrine 1-100,000 buffered w/ 8.4% NaHCO3 Instrument used: flexible razor blade   Hemostasis achieved with: pressure, aluminum chloride and electrodesiccation   Outcome: patient tolerated procedure well    Destruction of  lesion  Destruction method: electrodesiccation and curettage   Informed consent: discussed and consent obtained   Curettage performed in three different directions: Yes   Electrodesiccation performed over the curetted area: Yes   Final wound size (cm):  0.8 Hemostasis achieved with:  pressure, aluminum chloride and electrodesiccation Outcome: patient tolerated procedure well with no complications   Post-procedure details: wound care instructions given   Additional details:  Mupirocin ointment and Bandaid applied  Specimen 1 - Surgical pathology Differential Diagnosis: Isk vs wart r/o scc   Check Margins: No Isk vs wart r/o scc  INFLAMED SEBORRHEIC KERATOSIS right breast x 1 Symptomatic, irritating, patient would like treated. Destruction of lesion - right breast x 1  Destruction method: cryotherapy   Informed consent: discussed and consent obtained   Lesion destroyed using liquid nitrogen: Yes   Region frozen until ice ball extended beyond lesion: Yes   Outcome: patient tolerated procedure well with no complications   Post-procedure details: wound care instructions given   Additional details:  Prior to procedure, discussed risks of blister formation, small wound, skin dyspigmentation, or rare scar following cryotherapy. Recommend Vaseline ointment to treated areas while healing.  ACTINIC KERATOSIS nasal dorsum x 1 Actinic keratoses are precancerous spots that appear secondary to cumulative UV radiation exposure/sun exposure over time. They are chronic with expected duration over 1 year. A portion of actinic keratoses will progress to squamous cell carcinoma of the skin. It is not possible to reliably predict which spots will progress to skin cancer and so treatment is recommended to prevent development  of skin cancer.  Recommend daily broad spectrum sunscreen SPF 30+ to sun-exposed areas, reapply every 2 hours as needed.  Recommend staying in the shade or wearing long sleeves, sun  glasses (UVA+UVB protection) and wide brim hats (4-inch brim around the entire circumference of the hat). Call for new or changing lesions. Destruction of lesion - nasal dorsum x 1  Destruction method: cryotherapy   Informed consent: discussed and consent obtained   Lesion destroyed using liquid nitrogen: Yes   Region frozen until ice ball extended beyond lesion: Yes   Outcome: patient tolerated procedure well with no complications   Post-procedure details: wound care instructions given   Additional details:  Prior to procedure, discussed risks of blister formation, small wound, skin dyspigmentation, or rare scar following cryotherapy. Recommend Vaseline ointment to treated areas while healing.   ACTINIC DAMAGE - chronic, secondary to cumulative UV radiation exposure/sun exposure over time - diffuse scaly erythematous macules with underlying dyspigmentation - Recommend daily broad spectrum sunscreen SPF 30+ to sun-exposed areas, reapply every 2 hours as needed.  - Recommend staying in the shade or wearing long sleeves, sun glasses (UVA+UVB protection) and wide brim hats (4-inch brim around the entire circumference of the hat). - Call for new or changing lesions.   Return for keep follow up as scheduled.  I, Asher Muir, CMA, am acting as scribe for Willeen Niece, MD.   Documentation: I have reviewed the above documentation for accuracy and completeness, and I agree with the above.  Willeen Niece, MD

## 2023-06-10 LAB — SURGICAL PATHOLOGY

## 2023-06-11 ENCOUNTER — Telehealth: Payer: Self-pay

## 2023-06-11 NOTE — Telephone Encounter (Signed)
Advised patient biopsy of the left anterior thigh was benign wart, already treated with EDC.

## 2023-06-11 NOTE — Telephone Encounter (Signed)
-----   Message from Willeen Niece sent at 06/10/2023  9:02 PM EST ----- 1. Skin, left thigh - anterior :       VERRUCA VULGARIS, IRRITATED   Benign irritated wart, already treated with [facility] - please call patient

## 2023-06-14 ENCOUNTER — Other Ambulatory Visit: Payer: Self-pay

## 2023-06-15 ENCOUNTER — Telehealth: Payer: Commercial Managed Care - PPO | Admitting: Family Medicine

## 2023-06-15 DIAGNOSIS — J40 Bronchitis, not specified as acute or chronic: Secondary | ICD-10-CM | POA: Diagnosis not present

## 2023-06-15 MED ORDER — PROMETHAZINE-DM 6.25-15 MG/5ML PO SYRP: 5.0000 mL | ORAL_SOLUTION | Freq: Four times a day (QID) | ORAL | 0 refills | Status: AC | PRN

## 2023-06-15 MED ORDER — DOXYCYCLINE HYCLATE 100 MG PO TABS: 100.0000 mg | ORAL_TABLET | Freq: Two times a day (BID) | ORAL | 0 refills | Status: AC

## 2023-06-15 NOTE — Progress Notes (Signed)
Virtual Visit Consent   Lori Morris, you are scheduled for a virtual visit with a [facility] provider today. Just as with appointments in the office, your consent must be obtained to participate. Your consent will be active for this visit and any virtual visit you may have with one of our providers in the next 365 days. If you have a MyChart account, a copy of this consent can be sent to you electronically.  As this is a virtual visit, video technology does not allow for your provider to perform a traditional examination. This may limit your provider's ability to fully assess your condition. If your provider identifies any concerns that need to be evaluated in person or the need to arrange testing (such as labs, EKG, etc.), we will make arrangements to do so. Although advances in technology are sophisticated, we cannot ensure that it will always work on either your end or our end. If the connection with a video visit is poor, the visit may have to be switched to a telephone visit. With either a video or telephone visit, we are not always able to ensure that we have a secure connection.  By engaging in this virtual visit, you consent to the provision of healthcare and authorize for your insurance to be billed (if applicable) for the services provided during this visit. Depending on your insurance coverage, you may receive a charge related to this service.  I need to obtain your verbal consent now. Are you willing to proceed with your visit today? [name] has provided verbal consent on 06/15/2023 for a virtual visit (video or telephone). Georgana Curio, FNP  Date: 06/15/2023 10:54 AM  Virtual Visit via Video Note   I, Georgana Curio, connected with  Lori Morris  ([id number], 07/31/1960) on 06/15/23 at 10:45 AM EST by a video-enabled telemedicine application and verified that I am speaking with the correct person using two identifiers.  Location: Patient: Virtual Visit Location Patient:  Home Provider: Virtual Visit Location Provider: Home Office   I discussed the limitations of evaluation and management by telemedicine and the availability of in person appointments. The patient expressed understanding and agreed to proceed.    History of Present Illness: Lori Morris is a 63 y.o. who identifies as a female who was assigned female at birth, and is being seen today for cough for a week worsening. Dry not productive with thick green mucus. Low grade fever. No wheezing or sob. Covid testing neg.   HPI: HPI  Problems:  Patient Active Problem List   Diagnosis Date Noted   Vasomotor symptoms due to menopause 04/28/2018    Allergies:  Allergies  Allergen Reactions   Erythromycin Nausea Only    GI upset   Neosporin [Neomycin-Bacitracin Zn-Polymyx] Hives and Rash   Medications:  Current Outpatient Medications:    ALPRAZolam (XANAX) 0.25 MG tablet, Take 0.25 mg by mouth 2 (two) times daily as needed., Disp: , Rfl:    ALPRAZolam (XANAX) 0.25 MG tablet, Take 1 tablet (0.25 mg total) by mouth 2 (two) times daily as needed for sleep or anxiety., Disp: 90 tablet, Rfl: 1   Azelaic Acid (FINACEA) 15 % gel, Apply 1 Application topically as directed. Daily to bid to face for Rosacea, After skin is thoroughly washed and patted dry, gently but thoroughly massage a thin film of azelaic acid cream into the affected area twice daily, in the morning and evening., Disp: 50 g, Rfl: 11   azelastine (ASTELIN) 0.1 %  nasal spray, Place 2 sprays into both nostrils 2 (two) times daily., Disp: 30 mL, Rfl: 0   benzonatate (TESSALON) 200 MG capsule, Take 1 capsule (200 mg total) by mouth 3 (three) times daily., Disp: 30 capsule, Rfl: 0   cetirizine-pseudoephedrine (ZYRTEC-D) 5-120 MG tablet, Take 1 tablet by mouth daily., Disp: , Rfl:    desonide (DESOWEN) 0.05 % lotion, Apply topically 2 (two) times daily. (Patient taking differently: Apply topically 2 (two) times daily as needed.), Disp: 59 mL, Rfl:  2   doxycycline (PERIOSTAT) 20 MG tablet, Take 1 tablet (20 mg total) by mouth 2 (two) times daily. Take with food and drink, Disp: 60 tablet, Rfl: 6   finasteride (PROSCAR) 5 MG tablet, Take 1 tablet (5 mg total) by mouth daily., Disp: 90 tablet, Rfl: 1   finasteride (PROSCAR) 5 MG tablet, Take 1 tablet (5 mg total) by mouth daily., Disp: 90 tablet, Rfl: 3   hydrochlorothiazide (HYDRODIURIL) 25 MG tablet, Take 25 mg by mouth daily as needed., Disp: , Rfl:    hydrochlorothiazide (HYDRODIURIL) 25 MG tablet, Take 1 tablet (25 mg total) by mouth once daily, Disp: 90 tablet, Rfl: 3   levothyroxine (SYNTHROID) 25 MCG tablet, Take 1 tablet (25 mcg total) by mouth daily on an empty stomach with a glass of water at least 30-60 mins before breakfast., Disp: 90 tablet, Rfl: 3   levothyroxine (SYNTHROID) 25 MCG tablet, Take 1 tablet (25 mcg total) by mouth daily before breakfast., Disp: 90 tablet, Rfl: 3   minoxidil (LONITEN) 2.5 MG tablet, Take 1 tablet (2.5 mg total) by mouth daily., Disp: 90 tablet, Rfl: 1   minoxidil (LONITEN) 2.5 MG tablet, Take 1 tablet (2.5 mg total) by mouth daily., Disp: 90 tablet, Rfl: 3   pantoprazole (PROTONIX) 40 MG tablet, Take 1 tablet (40 mg total) by mouth once daily, Disp: 90 tablet, Rfl: 3   pantoprazole (PROTONIX) 40 MG tablet, Take 1 tablet (40 mg total) by mouth daily., Disp: 90 tablet, Rfl: 3   progesterone (PROMETRIUM) 100 MG capsule, Take 1 capsule (100 mg total) by mouth Nightly. 6 nights on, 1 night off, Disp: 90 capsule, Rfl: 3   progesterone (PROMETRIUM) 100 MG capsule, Take 1 capsule (100 mg total) by mouth daily., Disp: 90 capsule, Rfl: 1   rOPINIRole (REQUIP) 0.25 MG tablet, Take 3 tablets by mouth daily., Disp: , Rfl:    rOPINIRole (REQUIP) 0.25 MG tablet, TAKE 3 TABLETS BY MOUTH EVERY DAY, Disp: 270 tablet, Rfl: 3   venlafaxine XR (EFFEXOR-XR) 37.5 MG 24 hr capsule, Take 1 capsule by mouth daily., Disp: , Rfl:    venlafaxine XR (EFFEXOR-XR) 37.5 MG 24 hr  capsule, Take 1 capsule (37.5 mg total) by mouth daily., Disp: 90 capsule, Rfl: 1  Observations/Objective: Patient is well-developed, well-nourished in no acute distress.  Resting comfortably  at home.  Head is normocephalic, atraumatic.  No labored breathing. Speech is clear and coherent with logical content.  Patient is alert and oriented at baseline.    Assessment and Plan: 1. Bronchitis (Primary)  Increase fluids humidifier at night, tylenol or ibuprofen as directed, UC if sx worsen.   Follow Up Instructions: I discussed the assessment and treatment plan with the patient. The patient was provided an opportunity to ask questions and all were answered. The patient agreed with the plan and demonstrated an understanding of the instructions.  A copy of instructions were sent to the patient via MyChart unless otherwise noted below.     The  patient was advised to call back or seek an in-person evaluation if the symptoms worsen or if the condition fails to improve as anticipated.    Georgana Curio, FNP

## 2023-06-15 NOTE — Patient Instructions (Signed)

## 2023-06-17 ENCOUNTER — Telehealth: Payer: Commercial Managed Care - PPO

## 2023-06-17 ENCOUNTER — Other Ambulatory Visit (HOSPITAL_COMMUNITY): Payer: Self-pay

## 2023-06-17 ENCOUNTER — Telehealth: Payer: Self-pay

## 2023-06-17 ENCOUNTER — Other Ambulatory Visit: Payer: Self-pay

## 2023-06-17 ENCOUNTER — Other Ambulatory Visit: Payer: Self-pay | Admitting: Dermatology

## 2023-06-17 MED ORDER — DOXYCYCLINE HYCLATE 20 MG PO TABS
20.0000 mg | ORAL_TABLET | Freq: Two times a day (BID) | ORAL | 6 refills | Status: DC
  Filled 2023-06-17 – 2023-07-14 (×2): qty 60, 30d supply, fill #0
  Filled 2023-08-25: qty 60, 30d supply, fill #1
  Filled 2023-09-20: qty 60, 30d supply, fill #2
  Filled 2023-10-28: qty 60, 30d supply, fill #3

## 2023-06-17 NOTE — Telephone Encounter (Signed)
I contacted patient's cell phone number, and line picked up, but no one responded. I called back and the same thing occurred.

## 2023-06-17 NOTE — Telephone Encounter (Signed)
Patient left nurse VM. The area treated on her leg is now red, itching and causing some pain.  Would you like to send in antibiotic ointment or have patient come in for appt?

## 2023-06-17 NOTE — Telephone Encounter (Signed)
It looks like patient was also prescribed Doxycycline 100mg  po BID x 10 days by Georgana Curio W,FNP on 06/15/23. She left the voicemail on Friday when our office is closed so I'm not sure if that was prescribed for healing ED&C site or something else.

## 2023-06-18 NOTE — Telephone Encounter (Signed)
Left voicemail for patient to return my call.

## 2023-06-19 NOTE — Telephone Encounter (Signed)
I spoke with patient and she said that the lesion is much better, and she doesn't need anything sent in at this time.

## 2023-06-25 DIAGNOSIS — E039 Hypothyroidism, unspecified: Secondary | ICD-10-CM | POA: Diagnosis not present

## 2023-06-25 DIAGNOSIS — J209 Acute bronchitis, unspecified: Secondary | ICD-10-CM | POA: Diagnosis not present

## 2023-06-25 DIAGNOSIS — G4733 Obstructive sleep apnea (adult) (pediatric): Secondary | ICD-10-CM | POA: Diagnosis not present

## 2023-07-15 ENCOUNTER — Other Ambulatory Visit (HOSPITAL_COMMUNITY): Payer: Self-pay

## 2023-07-15 ENCOUNTER — Other Ambulatory Visit: Payer: Self-pay

## 2023-08-25 ENCOUNTER — Other Ambulatory Visit (HOSPITAL_COMMUNITY): Payer: Self-pay

## 2023-08-26 ENCOUNTER — Other Ambulatory Visit: Payer: Self-pay

## 2023-08-26 DIAGNOSIS — Z1211 Encounter for screening for malignant neoplasm of colon: Secondary | ICD-10-CM | POA: Diagnosis not present

## 2023-08-26 MED ORDER — NA SULFATE-K SULFATE-MG SULF 17.5-3.13-1.6 GM/177ML PO SOLN
ORAL | 0 refills | Status: DC
Start: 2023-08-26 — End: 2024-03-05
  Filled 2023-08-26 – 2023-09-20 (×2): qty 354, 1d supply, fill #0

## 2023-09-06 ENCOUNTER — Other Ambulatory Visit: Payer: Self-pay

## 2023-09-20 ENCOUNTER — Other Ambulatory Visit: Payer: Self-pay

## 2023-09-20 ENCOUNTER — Other Ambulatory Visit (HOSPITAL_COMMUNITY): Payer: Self-pay

## 2023-09-20 MED ORDER — VENLAFAXINE HCL ER 37.5 MG PO CP24
37.5000 mg | ORAL_CAPSULE | Freq: Every day | ORAL | 1 refills | Status: DC
  Filled 2023-09-20: qty 90, 90d supply, fill #0
  Filled 2024-01-07: qty 90, 90d supply, fill #1

## 2023-09-25 ENCOUNTER — Other Ambulatory Visit (HOSPITAL_COMMUNITY): Payer: Self-pay

## 2023-09-25 ENCOUNTER — Other Ambulatory Visit: Payer: Self-pay

## 2023-10-09 ENCOUNTER — Other Ambulatory Visit: Payer: Self-pay

## 2023-10-28 ENCOUNTER — Ambulatory Visit: Admission: RE | Admit: 2023-10-28 | Source: Home / Self Care | Admitting: Gastroenterology

## 2023-10-28 SURGERY — COLONOSCOPY
Anesthesia: General

## 2023-10-29 ENCOUNTER — Other Ambulatory Visit: Payer: Self-pay

## 2023-11-23 ENCOUNTER — Other Ambulatory Visit (HOSPITAL_COMMUNITY): Payer: Self-pay

## 2023-11-26 ENCOUNTER — Encounter: Payer: Self-pay | Admitting: Dermatology

## 2023-11-26 ENCOUNTER — Ambulatory Visit: Payer: Managed Care, Other (non HMO) | Admitting: Dermatology

## 2023-11-26 DIAGNOSIS — L409 Psoriasis, unspecified: Secondary | ICD-10-CM | POA: Diagnosis not present

## 2023-11-26 DIAGNOSIS — L649 Androgenic alopecia, unspecified: Secondary | ICD-10-CM | POA: Diagnosis not present

## 2023-11-26 DIAGNOSIS — W908XXA Exposure to other nonionizing radiation, initial encounter: Secondary | ICD-10-CM | POA: Diagnosis not present

## 2023-11-26 DIAGNOSIS — L719 Rosacea, unspecified: Secondary | ICD-10-CM

## 2023-11-26 DIAGNOSIS — S90861A Insect bite (nonvenomous), right foot, initial encounter: Secondary | ICD-10-CM | POA: Diagnosis not present

## 2023-11-26 DIAGNOSIS — L578 Other skin changes due to chronic exposure to nonionizing radiation: Secondary | ICD-10-CM | POA: Diagnosis not present

## 2023-11-26 DIAGNOSIS — D1801 Hemangioma of skin and subcutaneous tissue: Secondary | ICD-10-CM | POA: Diagnosis not present

## 2023-11-26 DIAGNOSIS — Z1283 Encounter for screening for malignant neoplasm of skin: Secondary | ICD-10-CM | POA: Diagnosis not present

## 2023-11-26 DIAGNOSIS — W57XXXA Bitten or stung by nonvenomous insect and other nonvenomous arthropods, initial encounter: Secondary | ICD-10-CM

## 2023-11-26 DIAGNOSIS — Z872 Personal history of diseases of the skin and subcutaneous tissue: Secondary | ICD-10-CM

## 2023-11-26 DIAGNOSIS — Z79899 Other long term (current) drug therapy: Secondary | ICD-10-CM

## 2023-11-26 DIAGNOSIS — S90862A Insect bite (nonvenomous), left foot, initial encounter: Secondary | ICD-10-CM

## 2023-11-26 DIAGNOSIS — L814 Other melanin hyperpigmentation: Secondary | ICD-10-CM

## 2023-11-26 DIAGNOSIS — D229 Melanocytic nevi, unspecified: Secondary | ICD-10-CM

## 2023-11-26 DIAGNOSIS — L821 Other seborrheic keratosis: Secondary | ICD-10-CM | POA: Diagnosis not present

## 2023-11-26 DIAGNOSIS — Q825 Congenital non-neoplastic nevus: Secondary | ICD-10-CM

## 2023-11-26 DIAGNOSIS — S80869A Insect bite (nonvenomous), unspecified lower leg, initial encounter: Secondary | ICD-10-CM

## 2023-11-26 MED ORDER — AZELAIC ACID 15 % EX GEL
1.0000 | Freq: Two times a day (BID) | CUTANEOUS | 11 refills | Status: AC
  Filled 2023-11-26: qty 50, 30d supply, fill #0

## 2023-11-26 MED ORDER — ZORYVE 0.3 % EX FOAM: 1.0000 | Freq: Every day | CUTANEOUS | 6 refills | Status: AC

## 2023-11-26 MED ORDER — DOXYCYCLINE HYCLATE 20 MG PO TABS
20.0000 mg | ORAL_TABLET | Freq: Two times a day (BID) | ORAL | 6 refills | Status: AC
  Filled 2023-11-26: qty 60, 30d supply, fill #0
  Filled 2023-12-23: qty 60, 30d supply, fill #1
  Filled 2024-01-22: qty 60, 30d supply, fill #2
  Filled 2024-02-24: qty 60, 30d supply, fill #3
  Filled 2024-03-29: qty 60, 30d supply, fill #4
  Filled 2024-04-12: qty 60, 30d supply, fill #5
  Filled 2024-04-24: qty 120, 60d supply, fill #5

## 2023-11-26 MED ORDER — FINASTERIDE 5 MG PO TABS
5.0000 mg | ORAL_TABLET | Freq: Every day | ORAL | 1 refills | Status: AC
  Filled 2023-11-26 – 2024-01-22 (×2): qty 90, 90d supply, fill #0

## 2023-11-26 MED ORDER — MINOXIDIL 2.5 MG PO TABS
2.5000 mg | ORAL_TABLET | Freq: Every day | ORAL | 1 refills | Status: AC
  Filled 2023-11-26 – 2024-01-22 (×2): qty 90, 90d supply, fill #0

## 2023-11-26 NOTE — Progress Notes (Signed)
 Follow-Up Visit   Subjective  Lori Morris is a 63 y.o. female who presents for the following: Skin Cancer Screening and Full Body Skin Exam, hx of Aks, Rosacea face, Doxycycline  20mg  2 po qd, Finacea  gel prn, Androgenetic Alopecia, scalp, Minoxidil  2.5mg  1/2 po qd, Finasteride  5mg  1/2 po qd, pt had d/c and started losing hair again so restarted.  No side effects other than increased hair growth on face.  The patient presents for Total-Body Skin Exam (TBSE) for skin cancer screening and mole check. The patient has spots, moles and lesions to be evaluated, some may be new or changing and the patient may have concern these could be cancer.    The following portions of the chart were reviewed this encounter and updated as appropriate: medications, allergies, medical history  Review of Systems:  No other skin or systemic complaints except as noted in HPI or Assessment and Plan.  Objective  Well appearing patient in no apparent distress; mood and affect are within normal limits.  A full examination was performed including scalp, head, eyes, ears, nose, lips, neck, chest, axillae, abdomen, back, buttocks, bilateral upper extremities, bilateral lower extremities, hands, feet, fingers, toes, fingernails, and toenails. All findings within normal limits unless otherwise noted below.   Relevant physical exam findings are noted in the Assessment and Plan.    Assessment & Plan   SKIN CANCER SCREENING PERFORMED TODAY.  ACTINIC DAMAGE chest - Chronic condition, secondary to cumulative UV/sun exposure - diffuse scaly erythematous macules with underlying dyspigmentation - Recommend daily broad spectrum sunscreen SPF 30+ to sun-exposed areas, reapply every 2 hours as needed.  - Staying in the shade or wearing long sleeves, sun glasses (UVA+UVB protection) and wide brim hats (4-inch brim around the entire circumference of the hat) are also recommended for sun protection.  - Call for new or changing  lesions.  LENTIGINES, SEBORRHEIC KERATOSES, HEMANGIOMAS - Benign normal skin lesions - Benign-appearing - Call for any changes  MELANOCYTIC NEVI - Tan-brown and/or pink-flesh-colored symmetric macules and papules - Benign appearing on exam today - Observation - Call clinic for new or changing moles - Recommend daily use of broad spectrum spf 30+ sunscreen to sun-exposed areas.   HISTORY OF PRECANCEROUS ACTINIC KERATOSIS - site(s) of PreCancerous Actinic Keratosis clear today. - these may recur and new lesions may form requiring treatment to prevent transformation into skin cancer - observe for new or changing spots and contact [facility] for appointment if occur - photoprotection with sun protective clothing; sunglasses and broad spectrum sunscreen with SPF of at least 30 + and frequent self skin exams recommended - yearly exams by a dermatologist recommended for persons with history of PreCancerous Actinic Keratoses   ROSACEA Exam Mid face erythema malar cheeks, nose, forehead with some light pink paps  Chronic and persistent condition with duration or expected duration over one year. Condition is improving with treatment but not currently at goal.    Rosacea is a chronic progressive skin condition usually affecting the face of adults, causing redness and/or acne bumps. It is treatable but not curable. It sometimes affects the eyes (ocular rosacea) as well. It may respond to topical and/or systemic medication and can flare with stress, sun exposure, alcohol, exercise, topical steroids (including hydrocortisone/cortisone 10) and some foods.  Daily application of broad spectrum spf 30+ sunscreen to face is recommended to reduce flares.  Treatment Plan Cont Doxycycline  20mg  2 po qd with food and drink Cont Finacea  gel prn to qhs  Doxycycline  should be taken with food to prevent nausea. Do not lay down for 30 minutes after taking. Be cautious with sun exposure and use good sun  protection while on this medication. Pregnant women should not take this medication.    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 Condition is improving with treatment but not currently at goal.    Female Androgenic Alopecia is a chronic condition related to genetics and/or hormonal changes.  In women androgenetic alopecia is commonly associated with menopause but may occur any time after puberty.  It causes hair thinning primarily on the crown with widening of the part and temporal hairline recession.  Can use OTC Rogaine  (minoxidil ) 5% solution/foam as directed.  Oral treatments in female patients who have no contraindication may include : - Low dose oral minoxidil  1.25 - 5mg  daily - Spironolactone 50 - 100mg  bid - Finasteride  2.5 - 5 mg daily Adjunctive therapies include: - Low Level Laser Light Therapy (LLLT) - Platelet-rich plasma injections (PRP) - Hair Transplants or scalp reduction   Treatment Plan: Cont Minoxidil  2.5mg  1/2 po qd Cont Finasteride  5mg  1/2 po qd  Doses of oral minoxidil  for hair loss are considered 'low dose'. This is because the doses used for hair loss are much lower than the doses which are used for conditions such as high blood pressure (hypertension). The doses used for hypertension are 10-40mg  per day.  Side effects are uncommon at the low doses (up to 2.5 mg/day) used to treat hair loss. Potential side effects, more commonly seen at higher doses, include: Increase in hair growth (hypertrichosis) elsewhere on face and body Temporary hair shedding upon starting medication which may last up to 4 weeks Ankle swelling, fluid retention, rapid weight gain more than 5 pounds Low blood pressure and  feeling lightheaded or dizzy when standing up quickly Fast or irregular heartbeat Headaches   Long term medication management.  Patient is using long term (months to years) prescription medication  to control their dermatologic condition.  These medications require periodic monitoring to evaluate for efficacy and side effects and may require periodic laboratory monitoring.    FLAT SK vs LENTIGO R mid eyebrow Exam: indistinct light tan macule  Treatment Plan: Benign-appearing.  Observation.  Call clinic for new or changing moles.  Recommend daily use of broad spectrum spf 30+ sunscreen to sun-exposed areas.  Discussed LN2, pt declines  INSECT BITE REACTION R arm, feet Exam: pink papules  Treatment Plan: Benign-appearing.  Observation.  Call clinic for new or changing lesions.  Recommend daily use of broad spectrum spf 30+ sunscreen to sun-exposed areas.   VASCULAR BIRTHMARK R ant thigh Exam: violaceous patch R ant thigh  Treatment Plan: Benign-appearing.  Observation.  Call clinic for new or changing moles.  Recommend daily use of broad spectrum spf 30+ sunscreen to sun-exposed areas.    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  Chronic and persistent condition with duration or expected duration over one year. Condition is bothersome/symptomatic for patient. Currently flared.   Psoriasis is a chronic non-curable, but treatable genetic/hereditary disease that may have other systemic features affecting other organ systems such as joints (Psoriatic Arthritis).  It is associated with an increased risk of inflammatory bowel disease, heart disease, non-alcoholic fatty liver disease, and depression.  Treatments include light and laser treatments; topical medications; and systemic medications including oral and injectables.  Treatment Plan: Start Zoryve  Foam qd to aa scalp and  face until clear, then prn flares Samples of Head & Shoulders, Cerave shampoos given to patient    Return in about 1 year (around 11/25/2024) for TBSE, Hx of AKs, Rosacea, Alopecia.  I, Grayce Saunas, RMA, am acting as scribe for Rexene Rattler, MD .   Documentation: I have reviewed the above documentation for accuracy and completeness, and I agree with the above.  Rexene Rattler, MD

## 2023-11-26 NOTE — Patient Instructions (Addendum)
 Your prescription was sent to [facility] in [place]. A representative from [facility] will contact you within 3 business hours to verify your address and insurance information to schedule a free delivery. If for any reason you do not receive a phone call from them, please reach out to them. Their phone number is [phone] and their hours are Monday-Friday 9:00 am-5:00 pm.     Basic OTC daily skin care regimen to prevent photoaging:   Recommend facial moisturizer with sunscreen SPF 30 every morning (OTC brands include CeraVe AM, Neutrogena, Eucerin, Cetaphil, Aveeno, La Roche Posay).  Can also apply a topical Vit C serum which is an antioxidant (OTC brands include CeraVe, La Roche Posay, and The Ordinary) underneath sunscreen in morning. If you are outside during the day in the summer for extended periods, especially swimming and/or sweating, make sure you apply a water resistant facial sunscreen lotion spf 30 or higher.   At night recommend a cream with retinol (a vitamin A derivative which stimulates collagen production) like CeraVe skin renewing retinol serum or ROC retinol correxion cream or Neutrogena rapid wrinkle repair cream. Retinol may cause skin irritation in people with sensitive skin.  Can use it every other day and/or apply on top of a hyaluronic acid (HA) moisturizer/serum (Neutrogena Hydroboost water cream) if better tolerated that way.  Retinol may also help with lightening brown spots.   Our office sells high quality, medically tested skin care lines such as Elta MD sunscreens (with Zinc), and Alastin skin care products, which are very effective in treating photoaging. The Alastin line includes cosmeceutical grade Vit.C serum, HA serum, Elastin stimulating moisturizers/serums, lightening serum, and sunscreens.  If you want prescription treatment, then you would need an appointment (Rx tretinoin and fade creams, Botox, filler injections, laser treatments, etc.) These  prescriptions and procedures are not covered by insurance but work very well.   Due to recent changes in healthcare laws, you may see results of your pathology and/or laboratory studies on MyChart before the doctors have had a chance to review them. We understand that in some cases there may be results that are confusing or concerning to you. Please understand that not all results are received at the same time and often the doctors may need to interpret multiple results in order to provide you with the best plan of care or course of treatment. Therefore, we ask that you please give us  2 business days to thoroughly review all your results before contacting the office for clarification. Should we see a critical lab result, you will be contacted sooner.   If You Need Anything After Your Visit  If you have any questions or concerns for your doctor, please call our main line at [phone] and press option 4 to reach your doctor's medical assistant. If no one answers, please leave a voicemail as directed and we will return your call as soon as possible. Messages left after 4 pm will be answered the following business day.   You may also send us  a message via MyChart. We typically respond to MyChart messages within 1-2 business days.  For prescription refills, please ask your pharmacy to contact our office. Our fax number is [phone].  If you have an urgent issue when the clinic is closed that cannot wait until the next business day, you can page your doctor at the number below.    Please note that while we do our best to be available for urgent issues outside of office hours, we  are not available 24/7.   If you have an urgent issue and are unable to reach us , you may choose to seek medical care at your doctor's office, retail clinic, urgent care center, or emergency room.  If you have a medical emergency, please immediately call 911 or go to the emergency department.  Pager Numbers  - Dr.  Hester: [phone]  - Dr. Jackquline: [phone]  - Dr. Claudene: [phone]   In the event of inclement weather, please call our main line at [phone] for an update on the status of any delays or closures.  Dermatology Medication Tips: Please keep the boxes that topical medications come in in order to help keep track of the instructions about where and how to use these. Pharmacies typically print the medication instructions only on the boxes and not directly on the medication tubes.   If your medication is too expensive, please contact our office at [phone] option 4 or send us  a message through MyChart.   We are unable to tell what your co-pay for medications will be in advance as this is different depending on your insurance coverage. However, we may be able to find a substitute medication at lower cost or fill out paperwork to get insurance to cover a needed medication.   If a prior authorization is required to get your medication covered by your insurance company, please allow us  1-2 business days to complete this process.  Drug prices often vary depending on where the prescription is filled and some pharmacies may offer cheaper prices.  The website www.goodrx.com contains coupons for medications through different pharmacies. The prices here do not account for what the cost may be with help from insurance (it may be cheaper with your insurance), but the website can give you the price if you did not use any insurance.  - You can print the associated coupon and take it with your prescription to the pharmacy.  - You may also stop by our office during regular business hours and pick up a GoodRx coupon card.  - If you need your prescription sent electronically to a different pharmacy, notify our office through [facility] or by phone at [phone] option 4.     Si Usted Necesita Algo Despus de Su Visita  Tambin puede enviarnos un mensaje a travs de Clinical cytogeneticist. Por lo  general respondemos a los mensajes de MyChart en el transcurso de 1 a 2 das hbiles.  Para renovar recetas, por favor pida a su farmacia que se ponga en contacto con nuestra oficina. Randi lakes de fax es [place] [phone].  Si tiene un asunto urgente cuando la clnica est cerrada y que no puede esperar hasta el siguiente da hbil, puede llamar/localizar a su doctor(a) al nmero que aparece a continuacin.   Por favor, tenga en cuenta que aunque hacemos todo lo posible para estar disponibles para asuntos urgentes fuera del horario de [place], no estamos disponibles las 24 horas del da, los 7 [address]  [address] de la [place].   Si tiene un problema urgente y no puede comunicarse con nosotros, puede optar por buscar atencin mdica  en el consultorio de su doctor(a), en una clnica privada, en un centro de atencin urgente o en una sala de emergencias.  Si tiene Engineer, drilling, por favor llame inmediatamente al 911 o vaya a la sala de emergencias.  Nmeros de bper  - Dr. Hester: [phone]  - Dra. Jackquline: [phone]  - Dr. Claudene: [phone]   En caso de inclemencias del  tiempo, por favor llame a landry lnea principal al [phone] para una actualizacin sobre el [place] de cualquier retraso o cierre.  Consejos para la medicacin en dermatologa: Por favor, guarde las cajas en las que vienen los medicamentos de uso tpico para ayudarle a seguir las instrucciones sobre dnde y cmo usarlos. Las farmacias generalmente imprimen las instrucciones del medicamento slo en las cajas y no directamente en los tubos del [place].   Si su medicamento es muy caro, por favor, pngase en contacto con landry rieger llamando al [phone] y presione la opcin 4 o envenos un mensaje a travs de Clinical cytogeneticist.   No podemos decirle cul ser su copago por los medicamentos por adelantado ya que esto es diferente dependiendo de la cobertura de su seguro. Sin embargo, es posible que podamos encontrar  un medicamento sustituto a Audiological scientist un formulario para que el seguro cubra el medicamento que se considera necesario.   Si se requiere una autorizacin previa para que su compaa de seguros malta su medicamento, por favor permtanos de 1 a 2 das hbiles para completar este proceso.  Los precios de los medicamentos varan con frecuencia dependiendo del Environmental consultant de dnde se surte la receta y alguna farmacias pueden ofrecer precios ms baratos.  El sitio web www.goodrx.com tiene cupones para medicamentos de Health and safety inspector. Los precios aqu no tienen en cuenta lo que podra costar con la ayuda del seguro (puede ser ms barato con su seguro), pero el sitio web puede darle el precio si no utiliz Tourist information centre manager.  - Puede imprimir el cupn correspondiente y llevarlo con su receta a la farmacia.  - Tambin puede pasar por nuestra oficina durante el horario de atencin regular y Education officer, museum una tarjeta de cupones de GoodRx.  - Si necesita que su receta se enve electrnicamente a una farmacia diferente, informe a nuestra oficina a travs de MyChart de [place] o por telfono llamando al [phone] y presione la opcin 4.

## 2023-11-27 ENCOUNTER — Other Ambulatory Visit (HOSPITAL_COMMUNITY): Payer: Self-pay

## 2023-11-27 ENCOUNTER — Other Ambulatory Visit: Payer: Self-pay

## 2023-11-30 ENCOUNTER — Other Ambulatory Visit (HOSPITAL_COMMUNITY): Payer: Self-pay

## 2023-12-23 ENCOUNTER — Other Ambulatory Visit (HOSPITAL_COMMUNITY): Payer: Self-pay

## 2024-01-07 ENCOUNTER — Other Ambulatory Visit: Payer: Self-pay

## 2024-01-07 ENCOUNTER — Other Ambulatory Visit (HOSPITAL_COMMUNITY): Payer: Self-pay

## 2024-01-07 MED ORDER — PROGESTERONE MICRONIZED 100 MG PO CAPS
100.0000 mg | ORAL_CAPSULE | Freq: Every day | ORAL | 0 refills | Status: DC
  Filled 2024-01-07: qty 90, 90d supply, fill #0

## 2024-01-22 ENCOUNTER — Other Ambulatory Visit (HOSPITAL_COMMUNITY): Payer: Self-pay

## 2024-01-22 ENCOUNTER — Other Ambulatory Visit: Payer: Self-pay

## 2024-01-26 DIAGNOSIS — Z1211 Encounter for screening for malignant neoplasm of colon: Secondary | ICD-10-CM | POA: Diagnosis not present

## 2024-01-30 DIAGNOSIS — R635 Abnormal weight gain: Secondary | ICD-10-CM | POA: Diagnosis not present

## 2024-01-30 DIAGNOSIS — Z713 Dietary counseling and surveillance: Secondary | ICD-10-CM | POA: Diagnosis not present

## 2024-01-31 LAB — COLOGUARD: COLOGUARD: NEGATIVE

## 2024-02-25 ENCOUNTER — Other Ambulatory Visit (HOSPITAL_COMMUNITY): Payer: Self-pay

## 2024-03-02 ENCOUNTER — Other Ambulatory Visit: Payer: Self-pay

## 2024-03-02 MED ORDER — PANTOPRAZOLE SODIUM 40 MG PO TBEC
40.0000 mg | DELAYED_RELEASE_TABLET | Freq: Every day | ORAL | 1 refills | Status: DC
  Filled 2024-03-02: qty 90, 90d supply, fill #0
  Filled 2024-05-24: qty 90, 90d supply, fill #1

## 2024-03-05 ENCOUNTER — Other Ambulatory Visit: Payer: Self-pay

## 2024-03-05 DIAGNOSIS — R5383 Other fatigue: Secondary | ICD-10-CM | POA: Diagnosis not present

## 2024-03-05 DIAGNOSIS — M810 Age-related osteoporosis without current pathological fracture: Secondary | ICD-10-CM | POA: Diagnosis not present

## 2024-03-05 DIAGNOSIS — M8580 Other specified disorders of bone density and structure, unspecified site: Secondary | ICD-10-CM | POA: Diagnosis not present

## 2024-03-05 DIAGNOSIS — E039 Hypothyroidism, unspecified: Secondary | ICD-10-CM | POA: Diagnosis not present

## 2024-03-05 DIAGNOSIS — Z79899 Other long term (current) drug therapy: Secondary | ICD-10-CM | POA: Diagnosis not present

## 2024-03-05 DIAGNOSIS — Z1231 Encounter for screening mammogram for malignant neoplasm of breast: Secondary | ICD-10-CM | POA: Diagnosis not present

## 2024-03-05 DIAGNOSIS — F419 Anxiety disorder, unspecified: Secondary | ICD-10-CM | POA: Diagnosis not present

## 2024-03-05 DIAGNOSIS — G4733 Obstructive sleep apnea (adult) (pediatric): Secondary | ICD-10-CM | POA: Diagnosis not present

## 2024-03-05 DIAGNOSIS — E7800 Pure hypercholesterolemia, unspecified: Secondary | ICD-10-CM | POA: Diagnosis not present

## 2024-03-05 DIAGNOSIS — R5381 Other malaise: Secondary | ICD-10-CM | POA: Diagnosis not present

## 2024-03-05 DIAGNOSIS — Z1331 Encounter for screening for depression: Secondary | ICD-10-CM | POA: Diagnosis not present

## 2024-03-05 DIAGNOSIS — Z13820 Encounter for screening for osteoporosis: Secondary | ICD-10-CM | POA: Diagnosis not present

## 2024-03-05 DIAGNOSIS — Z0000 Encounter for general adult medical examination without abnormal findings: Secondary | ICD-10-CM | POA: Diagnosis not present

## 2024-03-05 MED ORDER — TOPIRAMATE 50 MG PO TABS
50.0000 mg | ORAL_TABLET | Freq: Every day | ORAL | 5 refills | Status: AC
  Filled 2024-03-05: qty 90, 90d supply, fill #0

## 2024-03-06 ENCOUNTER — Other Ambulatory Visit: Payer: Self-pay

## 2024-03-06 ENCOUNTER — Other Ambulatory Visit: Payer: Self-pay | Admitting: Internal Medicine

## 2024-03-06 DIAGNOSIS — Z1231 Encounter for screening mammogram for malignant neoplasm of breast: Secondary | ICD-10-CM

## 2024-03-06 MED ORDER — HYDROCHLOROTHIAZIDE 25 MG PO TABS
25.0000 mg | ORAL_TABLET | Freq: Every day | ORAL | 3 refills | Status: AC
  Filled 2024-03-06: qty 90, 90d supply, fill #0
  Filled 2024-04-12: qty 90, 90d supply, fill #1

## 2024-03-06 MED ORDER — TOPIRAMATE 50 MG PO TABS
50.0000 mg | ORAL_TABLET | Freq: Every day | ORAL | 5 refills | Status: DC
  Filled 2024-03-06: qty 30, 30d supply, fill #0

## 2024-03-06 MED ORDER — FINASTERIDE 5 MG PO TABS: 5.0000 mg | ORAL_TABLET | Freq: Every day | ORAL | 3 refills | Status: AC

## 2024-03-06 MED ORDER — LEVOTHYROXINE SODIUM 25 MCG PO TABS
25.0000 ug | ORAL_TABLET | Freq: Every morning | ORAL | 3 refills | Status: AC
  Filled 2024-03-06 – 2024-03-29 (×2): qty 90, 90d supply, fill #0
  Filled 2024-04-12: qty 90, 90d supply, fill #1

## 2024-03-06 MED ORDER — VENLAFAXINE HCL ER 37.5 MG PO CP24
37.5000 mg | ORAL_CAPSULE | Freq: Every day | ORAL | 1 refills | Status: AC
  Filled 2024-04-12: qty 90, 90d supply, fill #0
  Filled 2024-06-21: qty 90, 90d supply, fill #1

## 2024-03-06 MED ORDER — ROPINIROLE HCL 0.25 MG PO TABS
0.7500 mg | ORAL_TABLET | Freq: Every day | ORAL | 3 refills | Status: AC
  Filled 2024-03-06: qty 270, 90d supply, fill #0
  Filled 2024-06-21: qty 270, 90d supply, fill #1
  Filled 2024-06-22: qty 200, 67d supply, fill #1

## 2024-03-09 DIAGNOSIS — M8588 Other specified disorders of bone density and structure, other site: Secondary | ICD-10-CM | POA: Diagnosis not present

## 2024-03-30 ENCOUNTER — Other Ambulatory Visit (HOSPITAL_COMMUNITY): Payer: Self-pay

## 2024-03-30 ENCOUNTER — Other Ambulatory Visit: Payer: Self-pay

## 2024-04-03 ENCOUNTER — Other Ambulatory Visit (HOSPITAL_COMMUNITY): Payer: Self-pay

## 2024-04-03 ENCOUNTER — Other Ambulatory Visit (HOSPITAL_BASED_OUTPATIENT_CLINIC_OR_DEPARTMENT_OTHER): Payer: Self-pay

## 2024-04-12 ENCOUNTER — Telehealth: Admitting: Nurse Practitioner

## 2024-04-12 ENCOUNTER — Other Ambulatory Visit (HOSPITAL_COMMUNITY): Payer: Self-pay

## 2024-04-12 DIAGNOSIS — J019 Acute sinusitis, unspecified: Secondary | ICD-10-CM | POA: Diagnosis not present

## 2024-04-12 DIAGNOSIS — B9789 Other viral agents as the cause of diseases classified elsewhere: Secondary | ICD-10-CM

## 2024-04-12 MED ORDER — PROGESTERONE MICRONIZED 100 MG PO CAPS
100.0000 mg | ORAL_CAPSULE | Freq: Every day | ORAL | 0 refills | Status: AC
  Filled 2024-04-12: qty 90, 90d supply, fill #0

## 2024-04-12 MED ORDER — PREDNISONE 20 MG PO TABS
20.0000 mg | ORAL_TABLET | Freq: Every day | ORAL | 0 refills | Status: AC
  Filled 2024-04-12: qty 5, 5d supply, fill #0

## 2024-04-12 MED ORDER — IPRATROPIUM BROMIDE 0.03 % NA SOLN
2.0000 | Freq: Two times a day (BID) | NASAL | 0 refills | Status: AC
  Filled 2024-04-12: qty 30, 75d supply, fill #0

## 2024-04-12 MED ORDER — PROMETHAZINE-DM 6.25-15 MG/5ML PO SYRP
5.0000 mL | ORAL_SOLUTION | Freq: Four times a day (QID) | ORAL | 0 refills | Status: AC | PRN
  Filled 2024-04-12: qty 240, 12d supply, fill #0

## 2024-04-12 NOTE — Progress Notes (Signed)
 We are sorry that you are not feeling well.  Here is how we plan to help!  Based on what you have shared with me it looks like you have sinusitis.  Sinusitis is inflammation and infection in the sinus cavities of the head.  Based on your presentation I believe you most likely have Acute Viral Sinusitis.This is an infection most likely caused by a virus. There is not specific treatment for viral sinusitis other than to help you with the symptoms until the infection runs its course.  You may use an oral decongestant such as Mucinex D or if you have glaucoma or high blood pressure use plain Mucinex. Saline nasal spray help and can safely be used as often as needed for congestion, I have prescribed: Ipratropium Bromide  nasal spray 0.03% 2 sprays in each nostril 2-3 times a day. I have also sent prednisone  and cough syrup.   Some authorities believe that zinc sprays or the use of Echinacea may shorten the course of your symptoms.  Providers prescribe antibiotics to treat infections caused by bacteria. Antibiotics are very powerful in treating bacterial infections when they are used properly. To maintain their effectiveness, they should be used only when necessary. Overuse of antibiotics has resulted in the development of superbugs that are resistant to treatment.   After careful review of your answers, I would not recommend an antibiotic for your condition.  Antibiotics are not effective against viruses and therefore should not be used to treat them. Common examples of infections caused by viruses include colds and flu. Please reach out to us  if you are not feeling any better in 5-7 days.    Sinus infections are not as easily transmitted as other respiratory infection, however we still recommend that you avoid close contact with loved ones, especially the very young and elderly.  Remember to wash your hands thoroughly throughout the day as this is the number one way to prevent the spread of infection!  Home  Care: Only take medications as instructed by your medical team. Do not take these medications with alcohol. A steam or ultrasonic humidifier can help congestion.  You can place a towel over your head and breathe in the steam from hot water coming from a faucet. Avoid close contacts especially the very young and the elderly. Cover your mouth when you cough or sneeze. Always remember to wash your hands.  Get Help Right Away If: You develop worsening fever or sinus pain. You develop a severe head ache or visual changes. Your symptoms persist after you have completed your treatment plan.  Make sure you Understand these instructions. Will watch your condition. Will get help right away if you are not doing well or get worse.  Your e-visit answers were reviewed by a board certified advanced clinical practitioner to complete your personal care plan.  Depending on the condition, your plan could have included both over the counter or prescription medications.  If there is a problem please reply  once you have received a response from your provider.  Your safety is important to us .  If you have drug allergies check your prescription carefully.    You can use MyChart to ask questions about today's visit, request a non-urgent call back, or ask for a work or school excuse for 24 hours related to this e-Visit. If it has been greater than 24 hours you will need to follow up with your provider, or enter a new e-Visit to address those concerns.  You will get  an e-mail in the next two days asking about your experience.  I hope that your e-visit has been valuable and will speed your recovery. Thank you for using e-visits.  I have spent 5 minutes in review of e-visit questionnaire, review and updating patient chart, medical decision making and response to patient.   Ailish Prospero W Lakaya Tolen, NP

## 2024-04-13 ENCOUNTER — Other Ambulatory Visit: Payer: Self-pay

## 2024-04-13 ENCOUNTER — Other Ambulatory Visit (HOSPITAL_COMMUNITY): Payer: Self-pay

## 2024-04-14 ENCOUNTER — Ambulatory Visit
Admission: EM | Admit: 2024-04-14 | Discharge: 2024-04-14 | Disposition: A | Discharge status: Home / Self Care | Attending: Emergency Medicine | Admitting: Emergency Medicine

## 2024-04-14 ENCOUNTER — Other Ambulatory Visit: Payer: Self-pay

## 2024-04-14 DIAGNOSIS — J069 Acute upper respiratory infection, unspecified: Secondary | ICD-10-CM | POA: Diagnosis not present

## 2024-04-14 DIAGNOSIS — J0100 Acute maxillary sinusitis, unspecified: Secondary | ICD-10-CM | POA: Diagnosis not present

## 2024-04-14 MED ORDER — ALBUTEROL SULFATE HFA 108 (90 BASE) MCG/ACT IN AERS
1.0000 | INHALATION_SPRAY | Freq: Four times a day (QID) | RESPIRATORY_TRACT | 0 refills | Status: AC | PRN
  Filled 2024-04-14: qty 18, 30d supply, fill #0

## 2024-04-14 MED ORDER — AMOXICILLIN-POT CLAVULANATE 875-125 MG PO TABS
1.0000 | ORAL_TABLET | Freq: Two times a day (BID) | ORAL | 0 refills | Status: AC
  Filled 2024-04-14: qty 20, 10d supply, fill #0

## 2024-04-14 NOTE — Discharge Instructions (Addendum)
 Take the Augmentin  and use the albuterol  inhaler as directed.  Take the prednisone  that was previously prescribed.  Follow-up with your primary care provider.

## 2024-04-14 NOTE — ED Triage Notes (Signed)
 Patient to Urgent Care with complaints of yellow nasal congestion/ bilateral ear fullness/ cough/ drainage. Denies any known fevers.  Symptoms x5 days. Prescribed prednisone / nasal spray/ cough medication via e-visit.

## 2024-04-14 NOTE — ED Provider Notes (Signed)
 Lori Morris    CSN: [id number] Arrival date & time: 04/14/24  9185      History   Chief Complaint Chief Complaint  Patient presents with   Cough   Nasal Congestion    HPI Lori Morris is a 63 y.o. female.  Patient presents with 5-day history of congestion, postnasal drip, yellow drainage, cough.  No fever, shortness of breath.  Patient had a telehealth visit on 04/12/2024; diagnosed with viral sinusitis; treated with prednisone , ipratropium nasal spray, Promethazine  DM.  She has not started the prednisone  yet.  Patient reports history of recurrent sinus infections.  The history is provided by the patient and medical records.    Past Medical History:  Diagnosis Date   Actinic keratosis    Anxiety    DDD (degenerative disc disease), lumbar    GERD (gastroesophageal reflux disease)    History of kidney stones    Hypothyroidism    Osteopenia    Restless leg syndrome    Sleep apnea    uses CPAP    Patient Active Problem List   Diagnosis Date Noted   Vasomotor symptoms due to menopause 04/28/2018    Past Surgical History:  Procedure Laterality Date   COLONOSCOPY  2014   wnl repeat 10 yrs   KYPHOPLASTY N/A 02/09/2021   Procedure: L1 KYPHOPLASTY;  Surgeon: Kathlynn Sharper, MD;  Location: [facility] ORS;  Service: Orthopedics;  Laterality: N/A;   OTHER SURGICAL HISTORY  03/2021   Cataract surgery both eyes   SUPRACERVICAL ABDOMINAL HYSTERECTOMY  2011   pelvic pain    OB History     Gravida  0   Para  0   Term  0   Preterm  0   AB  0   Living  0      SAB  0   IAB  0   Ectopic  0   Multiple  0   Live Births  0            Home Medications    Prior to Admission medications   Medication Sig Start Date End Date Taking? Authorizing Provider  albuterol  (VENTOLIN  HFA) 108 (90 Base) MCG/ACT inhaler Inhale 1-2 puffs into the lungs every 6 (six) hours as needed. 04/14/24  Yes Corlis Burnard DEL, NP  amoxicillin -clavulanate (AUGMENTIN ) 875-125 MG  tablet Take 1 tablet by mouth every 12 (twelve) hours for 10 days. 04/14/24 04/24/24 Yes Corlis Burnard DEL, NP  doxycycline  (PERIOSTAT ) 20 MG tablet Take 1 tablet (20 mg total) by mouth 2 (two) times daily. Take with food and drink, for Rosacea 11/26/23 06/23/24 Yes Jackquline Sawyer, MD  finasteride  (PROSCAR ) 5 MG tablet Take 1 tablet (5 mg total) by mouth daily. For hair thinning 11/26/23  Yes Jackquline Sawyer, MD  hydrochlorothiazide  (HYDRODIURIL ) 25 MG tablet Take 1 tablet (25 mg total) by mouth once daily 03/10/23  Yes   ipratropium (ATROVENT ) 0.03 % nasal spray Place 2 sprays into both nostrils every 12 (twelve) hours. 04/12/24  Yes Fleming, Zelda W, NP  levothyroxine  (SYNTHROID ) 25 MCG tablet Take 1 tablet (25 mcg total) by mouth daily before breakfast. 03/10/23  Yes   minoxidil  (LONITEN ) 2.5 MG tablet Take 1 tablet (2.5 mg total) by mouth daily. For hair thinning 11/26/23  Yes Jackquline Sawyer, MD  pantoprazole  (PROTONIX ) 40 MG tablet Take 1 tablet (40 mg total) by mouth daily. 03/10/23  Yes   progesterone  (PROMETRIUM ) 100 MG capsule Take 1 capsule (100 mg total) by mouth daily. 04/12/24  Yes   promethazine -dextromethorphan (PROMETHAZINE -DM) 6.25-15 MG/5ML syrup Take 5 mLs by mouth 4 (four) times daily as needed. 04/12/24  Yes Fleming, Zelda W, NP  rOPINIRole  (REQUIP ) 0.25 MG tablet TAKE 3 TABLETS BY MOUTH EVERY DAY 03/10/23  Yes   topiramate  (TOPAMAX ) 50 MG tablet Take 1 tablet (50 mg total) by mouth at bedtime. 03/05/24  Yes   venlafaxine  XR (EFFEXOR -XR) 37.5 MG 24 hr capsule Take 1 capsule (37.5 mg total) by mouth daily. 03/06/24  Yes   ALPRAZolam  (XANAX ) 0.25 MG tablet Take 0.25 mg by mouth 2 (two) times daily as needed. 07/25/19   [provider]  ALPRAZolam  (XANAX ) 0.25 MG tablet Take 1 tablet (0.25 mg total) by mouth 2 (two) times daily as needed for sleep or anxiety. 03/10/23     Azelaic Acid  15 % gel Apply 1 Application topically 2 (two) times daily. After skin is thoroughly washed and patted dry,  gently but thoroughly massage a thin film of azelaic acid  cream into the affected area twice daily, in the morning and evening for Rosacea 11/26/23   Jackquline Sawyer, MD  azelastine  (ASTELIN ) 0.1 % nasal spray Place 2 sprays into both nostrils 2 (two) times daily. 05/06/23     benzonatate  (TESSALON ) 200 MG capsule Take 1 capsule (200 mg total) by mouth 3 (three) times daily. 05/06/23     cetirizine-pseudoephedrine  (ZYRTEC-D) 5-120 MG tablet Take 1 tablet by mouth daily.    [provider]  desonide  (DESOWEN ) 0.05 % lotion Apply topically 2 (two) times daily. Patient taking differently: Apply topically 2 (two) times daily as needed. 10/27/19   Jackquline Sawyer, MD  finasteride  (PROSCAR ) 5 MG tablet Take 1 tablet (5 mg total) by mouth daily. 11/20/22   Jackquline Sawyer, MD  finasteride  (PROSCAR ) 5 MG tablet Take 1 tablet (5 mg total) by mouth once daily 03/06/24     hydrochlorothiazide  (HYDRODIURIL ) 25 MG tablet Take 25 mg by mouth daily as needed. 07/09/18   [provider]  hydrochlorothiazide  (HYDRODIURIL ) 25 MG tablet Take 1 tablet (25 mg total) by mouth once daily 03/06/24     levothyroxine  (SYNTHROID ) 25 MCG tablet Take 1 tablet (25 mcg total) by mouth daily on an empty stomach with a glass of water at least 30-60 mins before breakfast. 10/10/22     levothyroxine  (SYNTHROID ) 25 MCG tablet Take on an empty stomach with a glass of water at least 30-60 minutes before breakfast. 03/06/24     minoxidil  (LONITEN ) 2.5 MG tablet Take 1 tablet (2.5 mg total) by mouth daily. 11/20/22   Jackquline Sawyer, MD  pantoprazole  (PROTONIX ) 40 MG tablet Take 1 tablet (40 mg total) by mouth once daily 03/04/23     pantoprazole  (PROTONIX ) 40 MG tablet Take 1 tablet (40 mg total) by mouth daily. 03/02/24     predniSONE  (DELTASONE ) 20 MG tablet Take 1 tablet (20 mg total) by mouth daily with breakfast for 5 days. 04/12/24 04/18/24  Fleming, Zelda W, NP  progesterone  (PROMETRIUM ) 100 MG capsule Take 1 capsule (100 mg total)  by mouth Nightly. 6 nights on, 1 night off 01/25/23   Copland, Alicia B, PA-C  Roflumilast  (ZORYVE ) 0.3 % FOAM Apply 1 Application topically daily. qd to aa scalp and face until clear, then prn flares 11/26/23   Jackquline Sawyer, MD  rOPINIRole  (REQUIP ) 0.25 MG tablet Take 3 tablets by mouth daily. 10/11/22   [provider]  rOPINIRole  (REQUIP ) 0.25 MG tablet Take 3 tablets (0.75 mg total) by mouth daily. 03/06/24  topiramate  (TOPAMAX ) 50 MG tablet Take 1 tablet (50 mg total) by mouth at bedtime 03/06/24     venlafaxine  XR (EFFEXOR -XR) 37.5 MG 24 hr capsule Take 1 capsule by mouth daily. 10/10/22   [provider]    Family History Family History  Problem Relation Age of Onset   Hypothyroidism Mother    Heart disease Father    Hyperlipidemia Father    Heart disease Sister    Breast cancer Neg Hx     Social History Social History   Tobacco Use   Smoking status: Never   Smokeless tobacco: Never  Vaping Use   Vaping status: Never Used  Substance Use Topics   Alcohol use: Yes    Comment: soc   Drug use: No     Allergies   Erythromycin and Neosporin [neomycin-bacitracin zn-polymyx]   Review of Systems Review of Systems  Constitutional:  Negative for chills and fever.  HENT:  Positive for congestion, ear pain and postnasal drip. Negative for sore throat.   Respiratory:  Positive for cough. Negative for shortness of breath.      Physical Exam Triage Vital Signs ED Triage Vitals [04/14/24 0832]  Encounter Vitals Group     BP 139/82     Girls Systolic BP Percentile      Girls Diastolic BP Percentile      Boys Systolic BP Percentile      Boys Diastolic BP Percentile      Pulse Rate 94     Resp 18     Temp 98.1 F (36.7 C)     Temp src      SpO2 97 %     Weight      Height      Head Circumference      Peak Flow      Pain Score      Pain Loc      Pain Education      Exclude from Growth Chart    No data found.  Updated Vital Signs BP 139/82    Pulse 94   Temp 98.1 F (36.7 C)   Resp 18   SpO2 97%   Visual Acuity Right Eye Distance:   Left Eye Distance:   Bilateral Distance:    Right Eye Near:   Left Eye Near:    Bilateral Near:     Physical Exam Constitutional:      General: She is not in acute distress. HENT:     Right Ear: Tympanic membrane normal.     Left Ear: Tympanic membrane normal.     Nose: Congestion present.     Mouth/Throat:     Mouth: Mucous membranes are moist.     Pharynx: Oropharynx is clear.  Cardiovascular:     Rate and Rhythm: Normal rate and regular rhythm.     Heart sounds: Normal heart sounds.  Pulmonary:     Effort: Pulmonary effort is normal. No respiratory distress.     Breath sounds: Normal breath sounds.  Neurological:     Mental Status: She is alert.      UC Treatments / Results  Labs (all labs ordered are listed, but only abnormal results are displayed) Labs Reviewed - No data to display  EKG   Radiology No results found.  Procedures Procedures (including critical care time)  Medications Ordered in UC Medications - No data to display  Initial Impression / Assessment and Plan / UC Course  I have reviewed the triage vital signs and  the nursing notes.  Pertinent labs & imaging results that were available during my care of the patient were reviewed by me and considered in my medical decision making (see chart for details).    Acute sinusitis, acute upper respiratory infection.  Afebrile and vital signs are stable.  Lungs are clear and O2 sat is 97% on room air.  Treating today with Augmentin  and albuterol  inhaler.  Instructed patient to start taking the prednisone  that was prescribed on the telehealth visit.  Instructed her to schedule follow-up visit with her PCP.  Education provided on sinus infection and upper respiratory infection.  Patient agrees to plan of care.  Final Clinical Impressions(s) / UC Diagnoses   Final diagnoses:  Acute non-recurrent maxillary  sinusitis  Acute upper respiratory infection     Discharge Instructions      Take the Augmentin  and use the albuterol  inhaler as directed.  Take the prednisone  that was previously prescribed.  Follow-up with your primary care provider.     ED Prescriptions     Medication Sig Dispense Auth. Provider   amoxicillin -clavulanate (AUGMENTIN ) 875-125 MG tablet Take 1 tablet by mouth every 12 (twelve) hours for 10 days. 20 tablet Corlis Burnard DEL, NP   albuterol  (VENTOLIN  HFA) 108 (90 Base) MCG/ACT inhaler Inhale 1-2 puffs into the lungs every 6 (six) hours as needed. 18 g Corlis Burnard DEL, NP      PDMP not reviewed this encounter.   Corlis Burnard DEL, NP 04/14/24 [phone]

## 2024-04-24 ENCOUNTER — Other Ambulatory Visit (HOSPITAL_COMMUNITY): Payer: Self-pay

## 2024-04-26 MED ORDER — LEVOTHYROXINE SODIUM 25 MCG PO TABS
25.0000 ug | ORAL_TABLET | Freq: Every day | ORAL | 3 refills | Status: AC
  Filled 2024-04-26 – 2024-06-21 (×2): qty 90, 90d supply, fill #0

## 2024-04-27 ENCOUNTER — Other Ambulatory Visit (HOSPITAL_COMMUNITY): Payer: Self-pay

## 2024-04-28 ENCOUNTER — Other Ambulatory Visit: Payer: Self-pay

## 2024-04-28 ENCOUNTER — Other Ambulatory Visit (HOSPITAL_COMMUNITY): Payer: Self-pay

## 2024-05-25 ENCOUNTER — Other Ambulatory Visit (HOSPITAL_COMMUNITY): Payer: Self-pay

## 2024-06-08 ENCOUNTER — Other Ambulatory Visit: Payer: Self-pay

## 2024-06-08 MED ORDER — PANTOPRAZOLE SODIUM 40 MG PO TBEC
40.0000 mg | DELAYED_RELEASE_TABLET | Freq: Two times a day (BID) | ORAL | 1 refills | Status: AC
  Filled 2024-06-21: qty 180, 90d supply, fill #0

## 2024-06-22 ENCOUNTER — Other Ambulatory Visit: Payer: Self-pay

## 2024-06-22 ENCOUNTER — Other Ambulatory Visit (HOSPITAL_COMMUNITY): Payer: Self-pay

## 2024-06-23 ENCOUNTER — Other Ambulatory Visit: Payer: Self-pay

## 2024-12-21 ENCOUNTER — Encounter: Admitting: Dermatology
